# Patient Record
Sex: Female | Born: 1985 | Race: White | Hispanic: No | Marital: Married | Smoking: Never smoker
Health system: Southern US, Community
[De-identification: ages and names within clinical notes are randomized; demographics above are authoritative.]

## PROBLEM LIST (undated history)

## (undated) DIAGNOSIS — U071 COVID-19: Secondary | ICD-10-CM

## (undated) DIAGNOSIS — F32A Depression, unspecified: Secondary | ICD-10-CM

## (undated) DIAGNOSIS — F329 Major depressive disorder, single episode, unspecified: Secondary | ICD-10-CM

## (undated) DIAGNOSIS — M9261 Juvenile osteochondrosis of tarsus, right ankle: Secondary | ICD-10-CM

## (undated) DIAGNOSIS — F419 Anxiety disorder, unspecified: Secondary | ICD-10-CM

## (undated) DIAGNOSIS — M7661 Achilles tendinitis, right leg: Secondary | ICD-10-CM

## (undated) DIAGNOSIS — F909 Attention-deficit hyperactivity disorder, unspecified type: Secondary | ICD-10-CM

## (undated) DIAGNOSIS — R519 Headache, unspecified: Secondary | ICD-10-CM

## (undated) DIAGNOSIS — M79671 Pain in right foot: Secondary | ICD-10-CM

## (undated) DIAGNOSIS — R1013 Epigastric pain: Secondary | ICD-10-CM

## (undated) HISTORY — DX: Pain in right foot: M79.671

## (undated) HISTORY — DX: Attention-deficit hyperactivity disorder, unspecified type: F90.9

## (undated) HISTORY — PX: OTHER SURGICAL HISTORY: SHX169

## (undated) HISTORY — DX: Headache, unspecified: R51.9

## (undated) HISTORY — DX: Epigastric pain: R10.13

## (undated) HISTORY — PX: CHOLECYSTECTOMY: SHX55

## (undated) HISTORY — DX: COVID-19: U07.1

## (undated) HISTORY — DX: Achilles tendinitis, right leg: M76.61

## (undated) HISTORY — PX: KNEE ARTHROSCOPY: SUR90

## (undated) HISTORY — PX: DILATION AND CURETTAGE OF UTERUS: SHX78

## (undated) HISTORY — DX: Juvenile osteochondrosis of tarsus, right ankle: M92.61

---
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
## 2019-11-07 ENCOUNTER — Ambulatory Visit: Payer: BC Managed Care – PPO | Attending: Internal Medicine

## 2019-11-07 DIAGNOSIS — Z20822 Contact with and (suspected) exposure to covid-19: Secondary | ICD-10-CM

## 2019-11-09 LAB — NOVEL CORONAVIRUS, NAA: SARS-CoV-2, NAA: NOT DETECTED

## 2019-11-11 ENCOUNTER — Other Ambulatory Visit: Payer: Self-pay

## 2019-11-11 ENCOUNTER — Emergency Department (HOSPITAL_COMMUNITY)
Admission: EM | Admit: 2019-11-11 | Discharge: 2019-11-12 | Disposition: A | Discharge status: Home / Self Care | Payer: BC Managed Care – PPO | Attending: Emergency Medicine | Admitting: Emergency Medicine

## 2019-11-11 ENCOUNTER — Encounter (HOSPITAL_COMMUNITY): Payer: Self-pay | Admitting: *Deleted

## 2019-11-11 DIAGNOSIS — O26891 Other specified pregnancy related conditions, first trimester: Secondary | ICD-10-CM

## 2019-11-11 DIAGNOSIS — Z3A11 11 weeks gestation of pregnancy: Secondary | ICD-10-CM | POA: Diagnosis not present

## 2019-11-11 DIAGNOSIS — O200 Threatened abortion: Secondary | ICD-10-CM | POA: Diagnosis not present

## 2019-11-11 DIAGNOSIS — Z6791 Unspecified blood type, Rh negative: Secondary | ICD-10-CM | POA: Insufficient documentation

## 2019-11-11 DIAGNOSIS — O4691 Antepartum hemorrhage, unspecified, first trimester: Secondary | ICD-10-CM | POA: Diagnosis present

## 2019-11-11 DIAGNOSIS — Z2913 Encounter for prophylactic Rho(D) immune globulin: Secondary | ICD-10-CM | POA: Insufficient documentation

## 2019-11-11 DIAGNOSIS — O4690 Antepartum hemorrhage, unspecified, unspecified trimester: Secondary | ICD-10-CM

## 2019-11-11 HISTORY — DX: Anxiety disorder, unspecified: F41.9

## 2019-11-11 HISTORY — DX: Depression, unspecified: F32.A

## 2019-11-11 LAB — CBC WITH DIFFERENTIAL/PLATELET
Abs Immature Granulocytes: 0.02 10*3/uL (ref 0.00–0.07)
Basophils Absolute: 0 10*3/uL (ref 0.0–0.1)
Basophils Relative: 0 %
Eosinophils Absolute: 0.1 10*3/uL (ref 0.0–0.5)
Eosinophils Relative: 1 %
HCT: 41.4 % (ref 36.0–46.0)
Hemoglobin: 14 g/dL (ref 12.0–15.0)
Immature Granulocytes: 0 %
Lymphocytes Relative: 34 %
Lymphs Abs: 3.1 10*3/uL (ref 0.7–4.0)
MCH: 29.4 pg (ref 26.0–34.0)
MCHC: 33.8 g/dL (ref 30.0–36.0)
MCV: 87 fL (ref 80.0–100.0)
Monocytes Absolute: 0.6 10*3/uL (ref 0.1–1.0)
Monocytes Relative: 7 %
Neutro Abs: 5.1 10*3/uL (ref 1.7–7.7)
Neutrophils Relative %: 58 %
Platelets: 340 10*3/uL (ref 150–400)
RBC: 4.76 MIL/uL (ref 3.87–5.11)
RDW: 12.4 % (ref 11.5–15.5)
WBC: 9 10*3/uL (ref 4.0–10.5)
nRBC: 0 % (ref 0.0–0.2)

## 2019-11-11 NOTE — ED Triage Notes (Signed)
Pt with abd pain earlier, mild pain at present.  Vaginal bleeding started about 30 minutes ago, pink in color, denies clots per pt. Pt states this is her first pregnancy. Last office visit was 10/10/19.

## 2019-11-11 NOTE — ED Provider Notes (Addendum)
[facility] EMERGENCY DEPARTMENT Provider Note   CSN: [id number] Arrival date & time: 11/11/19  2253     History Chief Complaint  Patient presents with  . Vaginal Bleeding    Jennifer Meyers is a 34 y.o. female.  HPI patient is G1, P0 Ab0, last normal period started October 22.  She is followed by [facility] in [place].  She states she will be 12 weeks tomorrow.  She has had a normal pregnancy so far with some minor spotting earlier on in her pregnancy.  She states about 1045 she started having bleeding which was heavier than spotting but not as heavy as a period.  She has some minor discomfort in her lower groin that feels like menstrual cramping but she only rates it a 1 or 2 out of 10.  She denies dysuria but has had frequency since she got pregnant.  She states she is not wearing a pad.  PCP Patient, No Pcp Per      Past Medical History:  Diagnosis Date  . Anxiety   . Depression     There are no problems to display for this patient.   History reviewed. No pertinent surgical history.   OB History    Gravida  1   Para      Term      Preterm      AB      Living        SAB      TAB      Ectopic      Multiple      Live Births              History reviewed. No pertinent family history.  Social History   Tobacco Use  . Smoking status: Never Smoker  . Smokeless tobacco: Never Used  Substance Use Topics  . Alcohol use: Not Currently  . Drug use: Never    Home Medications Prior to Admission medications   Not on File  PNV  Allergies    Sulfa antibiotics  Review of Systems   Review of Systems  All other systems reviewed and are negative.   Physical Exam Updated Vital Signs BP 131/81 (BP Location: Left Arm)   Pulse 82   Resp 16   Ht 5\' 11"  (1.803 m)   Wt 90.7 kg   LMP 08/21/2019   SpO2 96%   BMI 27.89 kg/m   Physical Exam Vitals and nursing note reviewed.  Constitutional:      General: She is not in acute  distress.    Appearance: Normal appearance. She is well-developed. She is not ill-appearing or toxic-appearing.  HENT:     Head: Normocephalic and atraumatic.     Right Ear: External ear normal.     Left Ear: External ear normal.     Nose: No mucosal edema or rhinorrhea.     Mouth/Throat:     Dentition: No dental abscesses.     Pharynx: No uvula swelling.  Eyes:     Extraocular Movements: Extraocular movements intact.     Conjunctiva/sclera: Conjunctivae normal.     Pupils: Pupils are equal, round, and reactive to light.  Cardiovascular:     Rate and Rhythm: Normal rate and regular rhythm.     Heart sounds: Normal heart sounds. No murmur. No friction rub. No gallop.   Pulmonary:     Effort: Pulmonary effort is normal. No respiratory distress.     Breath sounds: Normal breath sounds. No wheezing,  rhonchi or rales.  Chest:     Chest wall: No tenderness or crepitus.  Abdominal:     General: Bowel sounds are normal. There is no distension.     Palpations: Abdomen is soft.     Tenderness: There is no abdominal tenderness. There is no guarding or rebound.  Genitourinary:    Comments: Normal external genitalia, there is no blood in the vault.  When the speculum was removed there is no blood on the speculum.  Her cervix appears closed.  Her uterus feels enlarged consistent with dates, her right ovary is minimally tender without masses, her left ovary is nontender.  Her cervix is closed.  I was in the room when we attempted fetal heart tones and we were unable to find them for a long enough time to get a accurate heart rate. Musculoskeletal:        General: No tenderness. Normal range of motion.     Cervical back: Full passive range of motion without pain, normal range of motion and neck supple.     Comments: Moves all extremities well.   Skin:    General: Skin is warm and dry.     Coloration: Skin is not pale.     Findings: No erythema or rash.  Neurological:     General: No focal  deficit present.     Mental Status: She is alert and oriented to person, place, and time.     Cranial Nerves: No cranial nerve deficit.  Psychiatric:        Mood and Affect: Mood is anxious.        Speech: Speech normal.        Behavior: Behavior normal.        Thought Content: Thought content normal.     ED Results / Procedures / Treatments   Labs (all labs ordered are listed, but only abnormal results are displayed) Results for orders placed or performed during the hospital encounter of 11/11/19  Wet prep, genital   Specimen: PATH Cytology Cervicovaginal Ancillary Only  Result Value Ref Range   Yeast Wet Prep HPF POC NONE SEEN NONE SEEN   Trich, Wet Prep NONE SEEN NONE SEEN   Clue Cells Wet Prep HPF POC NONE SEEN NONE SEEN   WBC, Wet Prep HPF POC FEW (A) NONE SEEN   Sperm NONE SEEN   hCG, quantitative, pregnancy  Result Value Ref Range   hCG, Beta Chain, Quant, S 16,816 (H) <5 mIU/mL  CBC with Differential  Result Value Ref Range   WBC 9.0 4.0 - 10.5 K/uL   RBC 4.76 3.87 - 5.11 MIL/uL   Hemoglobin 14.0 12.0 - 15.0 g/dL   HCT 38.1 01.7 - 51.0 %   MCV 87.0 80.0 - 100.0 fL   MCH 29.4 26.0 - 34.0 pg   MCHC 33.8 30.0 - 36.0 g/dL   RDW 25.8 52.7 - 78.2 %   Platelets 340 150 - 400 K/uL   nRBC 0.0 0.0 - 0.2 %   Neutrophils Relative % 58 %   Neutro Abs 5.1 1.7 - 7.7 K/uL   Lymphocytes Relative 34 %   Lymphs Abs 3.1 0.7 - 4.0 K/uL   Monocytes Relative 7 %   Monocytes Absolute 0.6 0.1 - 1.0 K/uL   Eosinophils Relative 1 %   Eosinophils Absolute 0.1 0.0 - 0.5 K/uL   Basophils Relative 0 %   Basophils Absolute 0.0 0.0 - 0.1 K/uL   Immature Granulocytes 0 %   Abs Immature  Granulocytes 0.02 0.00 - 0.07 K/uL  Urinalysis, Routine w reflex microscopic  Result Value Ref Range   Color, Urine YELLOW YELLOW   APPearance HAZY (A) CLEAR   Specific Gravity, Urine 1.018 1.005 - 1.030   pH 5.0 5.0 - 8.0   Glucose, UA NEGATIVE NEGATIVE mg/dL   Hgb urine dipstick SMALL (A) NEGATIVE    Bilirubin Urine NEGATIVE NEGATIVE   Ketones, ur NEGATIVE NEGATIVE mg/dL   Protein, ur NEGATIVE NEGATIVE mg/dL   Nitrite NEGATIVE NEGATIVE   Leukocytes,Ua NEGATIVE NEGATIVE   RBC / HPF 0-5 0 - 5 RBC/hpf   WBC, UA 0-5 0 - 5 WBC/hpf   Bacteria, UA NONE SEEN NONE SEEN   Squamous Epithelial / LPF 0-5 0 - 5   Mucus PRESENT   ABO/Rh  Result Value Ref Range   ABO/RH(D)      O NEG Performed at [facility], [address], [place], [place] [address]   Rh IG workup (includes ABO/Rh)  Result Value Ref Range   Gestational Age(Wks) 11    ABO/RH(D) O NEG    Unit Number [id number]    Blood Component Type RHIG    Unit division 00    Status of Unit ISSUED    Transfusion Status      OK TO TRANSFUSE Performed at [facility], [address] [place], [place] [address]    Laboratory interpretation all normal except pt is O neg    EKG None  Radiology No results found.  Procedures Procedures (including critical care time)  Medications Ordered in ED Medications  rho (d) immune globulin (RHIG/RHOPHYLAC) injection 49.5 mcg (has no administration in time range)    ED Course  I have reviewed the triage vital signs and the nursing notes.  Pertinent labs & imaging results that were available during my care of the patient were reviewed by me and considered in my medical decision making (see chart for details).    MDM Rules/Calculators/A&P                      Patient is extremely worried about having a pelvic exam and it being "safe".  I have reassured her that this is safe and necessary to look at her cervix and see how much bleeding she is having.  I asked patient if she was aware she was O- and she states her doctor had mentioned it but did not go into much detail.  We discussed that she needed to get a RhoGam injection tonight and that her doctor would also be doing it later on in her pregnancy and after she delivers.  I discussed with her that this baby will be fine however this is  done to protect her further children.  She again is anxious and asking if it is safe to get it.  When I do the MD Calc pregnancy due date calculator she is 11 weeks and 3/7 days pregnant.  She was given RhoGam 50 mcg.  Patient is to return later this morning to get an outpatient pelvic ultrasound.  We discussed she should have pelvic rest until her OB he states it is okay to resume having sex.  Final Clinical Impression(s) / ED Diagnoses Final diagnoses:  Vaginal bleeding in pregnancy  Threatened miscarriage  Rh negative status during pregnancy in first trimester    Rx / DC Orders ED Discharge Orders         Ordered    US PELVIC COMPLETE WITH TRANSVAGINAL  11/12/19 0056          Plan discharge  Devoria Albe, MD, Concha Pyo, MD 11/12/19 Nydia Bouton    Devoria Albe, MD 11/12/19 [phone]

## 2019-11-12 ENCOUNTER — Ambulatory Visit (HOSPITAL_COMMUNITY)
Admission: RE | Admit: 2019-11-12 | Discharge: 2019-11-12 | Disposition: A | Discharge status: Home / Self Care | Payer: BC Managed Care – PPO | Source: Ambulatory Visit | Attending: Emergency Medicine | Admitting: Emergency Medicine

## 2019-11-12 ENCOUNTER — Other Ambulatory Visit (HOSPITAL_COMMUNITY): Payer: Self-pay | Admitting: Emergency Medicine

## 2019-11-12 DIAGNOSIS — O209 Hemorrhage in early pregnancy, unspecified: Secondary | ICD-10-CM

## 2019-11-12 LAB — WET PREP, GENITAL
Clue Cells Wet Prep HPF POC: NONE SEEN
Sperm: NONE SEEN
Trich, Wet Prep: NONE SEEN
Yeast Wet Prep HPF POC: NONE SEEN

## 2019-11-12 LAB — URINALYSIS, ROUTINE W REFLEX MICROSCOPIC
Bacteria, UA: NONE SEEN
Bilirubin Urine: NEGATIVE
Glucose, UA: NEGATIVE mg/dL
Ketones, ur: NEGATIVE mg/dL
Leukocytes,Ua: NEGATIVE
Nitrite: NEGATIVE
Protein, ur: NEGATIVE mg/dL
Specific Gravity, Urine: 1.018 (ref 1.005–1.030)
pH: 5 (ref 5.0–8.0)

## 2019-11-12 LAB — ABO/RH: ABO/RH(D): O NEG

## 2019-11-12 LAB — HCG, QUANTITATIVE, PREGNANCY: hCG, Beta Chain, Quant, S: 16816 m[IU]/mL — ABNORMAL HIGH (ref <5)

## 2019-11-12 MED ORDER — RHO D IMMUNE GLOBULIN 1500 UNIT/2ML IJ SOSY
50.0000 ug | PREFILLED_SYRINGE | Freq: Once | INTRAMUSCULAR | Status: AC
  Administered 2019-11-12: 49.5 ug via INTRAMUSCULAR

## 2019-11-12 NOTE — ED Notes (Signed)
Unable to obtain. Baby moving.

## 2019-11-12 NOTE — Discharge Instructions (Addendum)
No sex, douching, using tampons, until your OB at [facility] states it is okay. You can take acetaminophen/tylenol for pain if needed. Return to the ED if you get severe abdominal pain, have bleeding heavier than a regular period, or if you pass gray tissue. Any bleeding during the first trimester or before 20 weeks is considered a threatened miscarriage. You can still have a normal pregnancy. You received rhogam 50 mcg IM tonight. Please call [phone] at 8:30 this morning to return to get an ultrasound done to look at your baby this morning. Please call your doctors at [facility] on Monday to let them know about your ED visit. They may want to see you earlier in the week.

## 2019-11-13 LAB — RH IG WORKUP (INCLUDES ABO/RH)
ABO/RH(D): O NEG
Gestational Age(Wks): 11
Unit division: 0

## 2019-11-14 LAB — GC/CHLAMYDIA PROBE AMP (~~LOC~~) NOT AT ARMC
Chlamydia: NEGATIVE
Neisseria Gonorrhea: NEGATIVE

## 2019-11-15 ENCOUNTER — Ambulatory Visit: Payer: BC Managed Care – PPO | Attending: Internal Medicine

## 2019-11-15 DIAGNOSIS — Z20822 Contact with and (suspected) exposure to covid-19: Secondary | ICD-10-CM

## 2019-11-16 LAB — NOVEL CORONAVIRUS, NAA: SARS-CoV-2, NAA: NOT DETECTED

## 2020-03-06 ENCOUNTER — Inpatient Hospital Stay
Admission: EM | Admit: 2020-03-06 | Discharge: 2020-03-09 | DRG: 419 | Disposition: A | Discharge status: Home / Self Care | Payer: BC Managed Care – PPO | Attending: Internal Medicine | Admitting: Internal Medicine

## 2020-03-06 ENCOUNTER — Encounter: Payer: Self-pay | Admitting: Emergency Medicine

## 2020-03-06 ENCOUNTER — Observation Stay: Payer: BC Managed Care – PPO

## 2020-03-06 ENCOUNTER — Emergency Department: Payer: BC Managed Care – PPO

## 2020-03-06 ENCOUNTER — Other Ambulatory Visit: Payer: Self-pay

## 2020-03-06 ENCOUNTER — Observation Stay (HOSPITAL_COMMUNITY): Payer: BC Managed Care – PPO

## 2020-03-06 DIAGNOSIS — R1013 Epigastric pain: Secondary | ICD-10-CM

## 2020-03-06 DIAGNOSIS — R7989 Other specified abnormal findings of blood chemistry: Secondary | ICD-10-CM

## 2020-03-06 DIAGNOSIS — Z79899 Other long term (current) drug therapy: Secondary | ICD-10-CM

## 2020-03-06 DIAGNOSIS — F329 Major depressive disorder, single episode, unspecified: Secondary | ICD-10-CM | POA: Diagnosis present

## 2020-03-06 DIAGNOSIS — K8050 Calculus of bile duct without cholangitis or cholecystitis without obstruction: Secondary | ICD-10-CM | POA: Diagnosis not present

## 2020-03-06 DIAGNOSIS — K8020 Calculus of gallbladder without cholecystitis without obstruction: Secondary | ICD-10-CM

## 2020-03-06 DIAGNOSIS — K219 Gastro-esophageal reflux disease without esophagitis: Secondary | ICD-10-CM | POA: Diagnosis present

## 2020-03-06 DIAGNOSIS — K8042 Calculus of bile duct with acute cholecystitis without obstruction: Principal | ICD-10-CM | POA: Diagnosis present

## 2020-03-06 DIAGNOSIS — F419 Anxiety disorder, unspecified: Secondary | ICD-10-CM | POA: Diagnosis present

## 2020-03-06 DIAGNOSIS — Z20822 Contact with and (suspected) exposure to covid-19: Secondary | ICD-10-CM | POA: Diagnosis present

## 2020-03-06 DIAGNOSIS — Z881 Allergy status to other antibiotic agents status: Secondary | ICD-10-CM

## 2020-03-06 DIAGNOSIS — R1011 Right upper quadrant pain: Secondary | ICD-10-CM

## 2020-03-06 LAB — CBC WITH DIFFERENTIAL/PLATELET
Abs Immature Granulocytes: 0.06 10*3/uL (ref 0.00–0.07)
Basophils Absolute: 0.1 10*3/uL (ref 0.0–0.1)
Basophils Relative: 0 %
Eosinophils Absolute: 0.1 10*3/uL (ref 0.0–0.5)
Eosinophils Relative: 1 %
HCT: 41.7 % (ref 36.0–46.0)
Hemoglobin: 14.7 g/dL (ref 12.0–15.0)
Immature Granulocytes: 0 %
Lymphocytes Relative: 22 %
Lymphs Abs: 3 10*3/uL (ref 0.7–4.0)
MCH: 29.9 pg (ref 26.0–34.0)
MCHC: 35.3 g/dL (ref 30.0–36.0)
MCV: 84.9 fL (ref 80.0–100.0)
Monocytes Absolute: 0.8 10*3/uL (ref 0.1–1.0)
Monocytes Relative: 6 %
Neutro Abs: 9.8 10*3/uL — ABNORMAL HIGH (ref 1.7–7.7)
Neutrophils Relative %: 71 %
Platelets: 362 10*3/uL (ref 150–400)
RBC: 4.91 MIL/uL (ref 3.87–5.11)
RDW: 12.4 % (ref 11.5–15.5)
WBC: 13.8 10*3/uL — ABNORMAL HIGH (ref 4.0–10.5)
nRBC: 0 % (ref 0.0–0.2)

## 2020-03-06 LAB — TROPONIN I (HIGH SENSITIVITY): Troponin I (High Sensitivity): <2 ng/L (ref <18)

## 2020-03-06 LAB — URINALYSIS, COMPLETE (UACMP) WITH MICROSCOPIC
Bacteria, UA: NONE SEEN
Bilirubin Urine: NEGATIVE
Glucose, UA: NEGATIVE mg/dL
Hgb urine dipstick: NEGATIVE
Ketones, ur: NEGATIVE mg/dL
Leukocytes,Ua: NEGATIVE
Nitrite: NEGATIVE
Protein, ur: 30 mg/dL — AB
Specific Gravity, Urine: 1.018 (ref 1.005–1.030)
pH: 9 — ABNORMAL HIGH (ref 5.0–8.0)

## 2020-03-06 LAB — RESPIRATORY PANEL BY RT PCR (FLU A&B, COVID)
Influenza A by PCR: NEGATIVE
Influenza B by PCR: NEGATIVE
SARS Coronavirus 2 by RT PCR: NEGATIVE

## 2020-03-06 LAB — COMPREHENSIVE METABOLIC PANEL
ALT: 55 U/L — ABNORMAL HIGH (ref 0–44)
AST: 107 U/L — ABNORMAL HIGH (ref 15–41)
Albumin: 4.2 g/dL (ref 3.5–5.0)
Alkaline Phosphatase: 92 U/L (ref 38–126)
Anion gap: 7 (ref 5–15)
BUN: 12 mg/dL (ref 6–20)
CO2: 27 mmol/L (ref 22–32)
Calcium: 9.3 mg/dL (ref 8.9–10.3)
Chloride: 100 mmol/L (ref 98–111)
Creatinine, Ser: 0.66 mg/dL (ref 0.44–1.00)
GFR calc Af Amer: >60 mL/min (ref >60)
GFR calc non Af Amer: >60 mL/min (ref >60)
Glucose, Bld: 111 mg/dL — ABNORMAL HIGH (ref 70–99)
Potassium: 3.8 mmol/L (ref 3.5–5.1)
Sodium: 134 mmol/L — ABNORMAL LOW (ref 135–145)
Total Bilirubin: 1.1 mg/dL (ref 0.3–1.2)
Total Protein: 7.6 g/dL (ref 6.5–8.1)

## 2020-03-06 LAB — POCT PREGNANCY, URINE: Preg Test, Ur: NEGATIVE

## 2020-03-06 LAB — HIV ANTIBODY (ROUTINE TESTING W REFLEX): HIV Screen 4th Generation wRfx: NONREACTIVE

## 2020-03-06 LAB — LIPASE, BLOOD: Lipase: 30 U/L (ref 11–51)

## 2020-03-06 LAB — PREGNANCY, URINE: Preg Test, Ur: NEGATIVE

## 2020-03-06 MED ORDER — MORPHINE SULFATE (PF) 2 MG/ML IV SOLN
2.0000 mg | INTRAVENOUS | Status: DC | PRN
  Administered 2020-03-06 – 2020-03-09 (×5): 2 mg via INTRAVENOUS
  Filled 2020-03-06 (×5): qty 1

## 2020-03-06 MED ORDER — ONDANSETRON HCL 4 MG/2ML IJ SOLN
4.0000 mg | Freq: Four times a day (QID) | INTRAMUSCULAR | Status: DC | PRN
  Administered 2020-03-08: 4 mg via INTRAVENOUS
  Filled 2020-03-06: qty 2

## 2020-03-06 MED ORDER — MORPHINE SULFATE (PF) 4 MG/ML IV SOLN
4.0000 mg | Freq: Once | INTRAVENOUS | Status: AC
  Administered 2020-03-06: 4 mg via INTRAVENOUS

## 2020-03-06 MED ORDER — SODIUM CHLORIDE 0.9 % IV SOLN: INTRAVENOUS | Status: DC

## 2020-03-06 MED ORDER — ONDANSETRON HCL 4 MG/2ML IJ SOLN
INTRAMUSCULAR | Status: AC
  Filled 2020-03-06: qty 2

## 2020-03-06 MED ORDER — ONDANSETRON HCL 4 MG PO TABS: 4.0000 mg | ORAL_TABLET | Freq: Four times a day (QID) | ORAL | Status: DC | PRN

## 2020-03-06 MED ORDER — MORPHINE SULFATE (PF) 4 MG/ML IV SOLN
INTRAVENOUS | Status: AC
  Filled 2020-03-06: qty 1

## 2020-03-06 MED ORDER — LORAZEPAM 0.5 MG PO TABS: 0.5000 mg | ORAL_TABLET | Freq: Once | ORAL | Status: DC | PRN

## 2020-03-06 MED ORDER — ONDANSETRON HCL 4 MG/2ML IJ SOLN
4.0000 mg | Freq: Once | INTRAMUSCULAR | Status: AC
  Administered 2020-03-06: 4 mg via INTRAVENOUS

## 2020-03-06 MED ORDER — GADOBUTROL 1 MMOL/ML IV SOLN
7.5000 mL | Freq: Once | INTRAVENOUS | Status: AC | PRN
  Administered 2020-03-06: 7.5 mL via INTRAVENOUS
  Filled 2020-03-06: qty 7.5

## 2020-03-06 MED ORDER — PIPERACILLIN-TAZOBACTAM 3.375 G IVPB
3.3750 g | Freq: Three times a day (TID) | INTRAVENOUS | Status: DC
  Administered 2020-03-06 – 2020-03-09 (×9): 3.375 g via INTRAVENOUS
  Filled 2020-03-06 (×11): qty 50

## 2020-03-06 MED ORDER — SODIUM CHLORIDE 0.9 % IV SOLN
1.5000 g | Freq: Once | INTRAVENOUS | Status: AC
  Administered 2020-03-06: 1.5 g via INTRAVENOUS
  Filled 2020-03-06: qty 4

## 2020-03-06 MED ORDER — KETOROLAC TROMETHAMINE 30 MG/ML IJ SOLN
30.0000 mg | Freq: Four times a day (QID) | INTRAMUSCULAR | Status: DC | PRN
  Administered 2020-03-06 – 2020-03-09 (×5): 30 mg via INTRAVENOUS
  Filled 2020-03-06 (×5): qty 1

## 2020-03-06 MED ORDER — MORPHINE SULFATE (PF) 2 MG/ML IV SOLN
2.0000 mg | Freq: Once | INTRAVENOUS | Status: AC
  Administered 2020-03-06: 2 mg via INTRAVENOUS
  Filled 2020-03-06: qty 1

## 2020-03-06 NOTE — ED Notes (Signed)
Pt reports relief following administration of pain medication. Instructed on what to do should pain increase or she feels as though she will pass out. Call light in reach, will continue to monitor.

## 2020-03-06 NOTE — ED Triage Notes (Signed)
Pt to triage via w/c, brought in by EMS from home; pt appears uncomfortable, grimacing; st sudden onset 2hrs PTA of upper abd pain radiating into back with no accomp symptoms; denies hx of same; upper abd tender to palpation, esp to rt upper

## 2020-03-06 NOTE — Consult Note (Signed)
Jennifer Antigua, MD [address] [place], [place], [place], [address] [address], [place], [place], [place], [address] Phone: [phone]  Fax: [phone]  Consultation  Referring Provider:     Dr. Damita Dunnings Primary Care Physician:  Adin Hector, MD Reason for Consultation:     Choledocholithiasis  Date of Admission:  03/06/2020 Date of Consultation:  03/06/2020         HPI:   Jennifer Meyers is a 34 y.o. female with right upper quadrant abdominal pain that started yesterday night associated with one episode of emesis.  No hematemesis.  No altered bowel habits or blood in stool.  No prior history of similar symptoms.  Labs show normal liver enzymes, and right upper quadrant ultrasound shows cholelithiasis and 3 mm stone in the common bile duct.  No fever.  Past Medical History:  Diagnosis Date  . Anxiety   . Depression     Past Surgical History:  Procedure Laterality Date  . DILATION AND CURETTAGE OF UTERUS      Prior to Admission medications   Medication Sig Start Date End Date Taking? Authorizing Provider  citalopram (CELEXA) 20 MG tablet Take 20 mg by mouth daily. 02/02/20  Yes [provider]  ibuprofen (ADVIL) 800 MG tablet Take 800 mg by mouth 3 (three) times daily as needed for pain. 11/30/19  Yes [provider]  montelukast (SINGULAIR) 10 MG tablet Take 10 mg by mouth daily. 02/02/20  Yes [provider]  Prenatal MV-Min-Fe Fum-FA-DHA (VITAFOL-OB+DHA) 65-1 & 250 MG MISC Take 1 packet by mouth daily. 01/15/20  Yes [provider]    No family history on file.   Social History   Tobacco Use  . Smoking status: Never Smoker  . Smokeless tobacco: Never Used  Substance Use Topics  . Alcohol use: Not Currently  . Drug use: Never    Allergies as of 03/06/2020 - Review Complete 03/06/2020  Allergen Reaction Noted  . Sulfa antibiotics Anaphylaxis 11/11/2019    Review of Systems:    All systems reviewed and negative  except where noted in HPI.   Physical Exam:  Vital signs in last 24 hours: Vitals:   03/06/20 0800 03/06/20 0900 03/06/20 1000 03/06/20 1100  BP: 97/69 92/68 90/65 103/63  Pulse: 69 72 65 70  Resp: _0 Temp:    98 F (36.7 C)  TempSrc:    Oral  SpO2: 98% 98% 98% 99%  Weight:      Height:         General:   Pleasant, cooperative in NAD Head:  Normocephalic and atraumatic. Eyes:   No icterus.   Conjunctiva pink. PERRLA. Ears:  Normal auditory acuity. Neck:  Supple; no masses or thyroidomegaly Lungs: Respirations even and unlabored. Lungs clear to auscultation bilaterally.   No wheezes, crackles, or rhonchi.  Abdomen:  Soft, nondistended, nontender. Normal bowel sounds. No appreciable masses or hepatomegaly.  No rebound or guarding.  Neurologic:  Alert and oriented x3;  grossly normal neurologically. Skin:  Intact without significant lesions or rashes. Cervical Nodes:  No significant cervical adenopathy. Psych:  Alert and cooperative. Normal affect.  LAB RESULTS: Recent Labs    03/06/20 0018  WBC 13.8*  HGB 14.7  HCT 41.7  PLT 362   BMET Recent Labs    03/06/20 0018  NA 134*  K 3.8  CL 100  CO2 27  GLUCOSE 111*  BUN 12  CREATININE 0.66  CALCIUM 9.3  LFT Recent Labs    03/06/20 0018  PROT 7.6  ALBUMIN 4.2  AST 107*  ALT 55*  ALKPHOS 92  BILITOT 1.1   PT/INR No results for input(s): LABPROT, INR in the last 72 hours.  STUDIES: US ABDOMEN LIMITED RUQ  Result Date: 03/06/2020 CLINICAL DATA:  Right upper quadrant pain EXAM: ULTRASOUND ABDOMEN LIMITED RIGHT UPPER QUADRANT COMPARISON:  None. FINDINGS: Gallbladder: Layering and shadowing gallstones. No wall thickening or focal tenderness. Common bile duct: Diameter: 5 mm. Although normal diameter there is visible echogenic foci consistent with calculi measuring up to 3 mm. Liver: No focal lesion identified. Within normal limits in parenchymal echogenicity. Portal vein is patent on color Doppler  imaging with normal direction of blood flow towards the liver. IMPRESSION: 1. Cholelithiasis and choledocholithiasis. 2. No evidence of acute cholecystitis. Electronically Signed   By: Monte Fantasia M.D.   On: 03/06/2020 04:06      Impression / Plan:   Jennifer Meyers is a 34 y.o. y/o female with right upper quadrant abdominal pain and ultrasound showing cholelithiasis and choledocholithiasis  Since alk phos and bilirubin are normal and right upper quadrant showed less than 5 mm stone in the common bile duct, would recommend MRCP to document if CBD stone is present.  Usually stones less than 5 mm do pass on their own.  If MRCP still shows presence of CBD stone, will discuss with Dr. Verl Blalock about proceeding with ERCP.  If MRCP negative, timing of cholecystectomy with possible intraoperative cholangiogram to be determined by surgery team  Can start clear liquid diet if patient tolerates after her MRCP.  N.p.o. past midnight.  Thank you for involving me in the care of this patient.      LOS: 0 days   Virgel Manifold, MD  03/06/2020, 1:05 PM

## 2020-03-06 NOTE — ED Notes (Signed)
Pt states she was awaken from her sleep at 10PM tonight due to pain in the abdomen and pain that radiated to the back. Pt reports normal BM. Denies change in diet. Pt is tender to palpation on the RUQ. Denies N/V/D

## 2020-03-06 NOTE — ED Notes (Signed)
Pt husband came running from room and states that she "passed out in bed." Carollee Herter, RN stepped into room and states she sternal rubbed pt and pt opened eyes and appeared to open eyes looking similar to waking up from sleep. Dr. Manson Passey notified and treatment team entered room. Orders to follow.

## 2020-03-06 NOTE — H&P (View-Only) (Signed)
SURGICAL CONSULTATION NOTE   HISTORY OF PRESENT ILLNESS (HPI):  34 y.o. female presented to [facility] ED for evaluation of abdominal pain that started yesterday at 10 pm. Patient reports started on the epigastric area. Pain also radiates to right upper quadrant. No aggravating factors. Alleviating factor has been IV pain medications since admission. Denies fever or chills. Denies previous episodes of biliary colic.   At ED she had ultrasound of the abdomen showing cholelithiasis stones in the common bile duct. No CBD duct dilation. No sign of cholecystitis. Normal bilirubin and alkaline phosphatase. No WBC count. I personally evaluated the images and the labs  Surgery is consulted by Dr. Roxan Hockey in this context for evaluation and management of cholelithiasis with choledocholitiasis.  PAST MEDICAL HISTORY (PMH):  Past Medical History:  Diagnosis Date  . Anxiety   . Depression      PAST SURGICAL HISTORY (PSH):  Past Surgical History:  Procedure Laterality Date  . DILATION AND CURETTAGE OF UTERUS       MEDICATIONS:  Prior to Admission medications   Medication Sig Start Date End Date Taking? Authorizing Provider  citalopram (CELEXA) 20 MG tablet Take 20 mg by mouth daily. 02/02/20  Yes [provider]  ibuprofen (ADVIL) 800 MG tablet Take 800 mg by mouth 3 (three) times daily as needed for pain. 11/30/19  Yes [provider]  montelukast (SINGULAIR) 10 MG tablet Take 10 mg by mouth daily. 02/02/20  Yes [provider]  Prenatal MV-Min-Fe Fum-FA-DHA (VITAFOL-OB+DHA) 65-1 & 250 MG MISC Take 1 packet by mouth daily. 01/15/20  Yes [provider]     ALLERGIES:  Allergies  Allergen Reactions  . Sulfa Antibiotics Anaphylaxis     SOCIAL HISTORY:  Social History   Socioeconomic History  . Marital status: Single    Spouse name: Not on file  . Number of children: Not on file  . Years of education: Not on file  . Highest education level: Not on file   Occupational History  . Not on file  Tobacco Use  . Smoking status: Never Smoker  . Smokeless tobacco: Never Used  Substance and Sexual Activity  . Alcohol use: Not Currently  . Drug use: Never  . Sexual activity: Not on file  Other Topics Concern  . Not on file  Social History Narrative  . Not on file   Social Determinants of Health   Financial Resource Strain:   . Difficulty of Paying Living Expenses:   Food Insecurity:   . Worried About Programme researcher, broadcasting/film/video in the Last Year:   . Barista in the Last Year:   Transportation Needs:   . Freight forwarder (Medical):   Marland Kitchen Lack of Transportation (Non-Medical):   Physical Activity:   . Days of Exercise per Week:   . Minutes of Exercise per Session:   Stress:   . Feeling of Stress :   Social Connections:   . Frequency of Communication with Friends and Family:   . Frequency of Social Gatherings with Friends and Family:   . Attends Religious Services:   . Active Member of Clubs or Organizations:   . Attends Banker Meetings:   Marland Kitchen Marital Status:   Intimate Partner Violence:   . Fear of Current or Ex-Partner:   . Emotionally Abused:   Marland Kitchen Physically Abused:   . Sexually Abused:       FAMILY HISTORY:  History reviewed. No pertinent family history.   REVIEW OF SYSTEMS:  Constitutional: denies weight loss, fever, chills, or sweats  Eyes: denies any other vision changes, history of eye injury  ENT: denies sore throat, hearing problems  Respiratory: denies shortness of breath, wheezing  Cardiovascular: denies chest pain, palpitations  Gastrointestinal: positive abdominal pain, nausea and vomiting Genitourinary: denies burning with urination or urinary frequency Musculoskeletal: denies any other joint pains or cramps  Skin: denies any other rashes or skin discolorations  Neurological: denies any other headache, dizziness, weakness  Psychiatric: denies any other depression, anxiety   All other review  of systems were negative   VITAL SIGNS:  Temp:  [97.7 F (36.5 C)-98.1 F (36.7 C)] 98.1 F (36.7 C) (05/05 1523) Pulse Rate:  [65-100] 80 (05/05 1523) Resp:  [12-20] 16 (05/05 1523) BP: (90-121)/(63-87) 104/77 (05/05 1523) SpO2:  [96 %-100 %] 100 % (05/05 1523) Weight:  [90.7 kg] 90.7 kg (05/05 0018)     Height: 5\' 11"  (180.3 cm) Weight: 90.7 kg BMI (Calculated): 27.91   INTAKE/OUTPUT:  This shift: Total I/O In: 1563.8 [I.V.:1561.4; IV Piggyback:2.4] Out: -   Last 2 shifts: @IOLAST2SHIFTS @   PHYSICAL EXAM:  Constitutional:  -- Normal body habitus  -- Awake, alert, and oriented x3  Eyes:  -- Pupils equally round and reactive to light  -- No scleral icterus  Ear, nose, and throat:  -- No jugular venous distension  Pulmonary:  -- No crackles  -- Equal breath sounds bilaterally -- Breathing non-labored at rest Cardiovascular:  -- S1, S2 present  -- No pericardial rubs Gastrointestinal:  -- Abdomen soft, nontender, non-distended, no guarding or rebound tenderness -- No abdominal masses appreciated, pulsatile or otherwise  Musculoskeletal and Integumentary:  -- Wounds: None appreciated -- Extremities: B/L UE and LE FROM, hands and feet warm, no edema  Neurologic:  -- Motor function: intact and symmetric -- Sensation: intact and symmetric   Labs:  CBC Latest Ref Rng & Units 03/06/2020 11/11/2019  WBC 4.0 - 10.5 K/uL 13.8(H) 9.0  Hemoglobin 12.0 - 15.0 g/dL 14.7 14.0  Hematocrit 36.0 - 46.0 % 41.7 41.4  Platelets 150 - 400 K/uL 362 340   CMP Latest Ref Rng & Units 03/06/2020  Glucose 70 - 99 mg/dL 111(H)  BUN 6 - 20 mg/dL 12  Creatinine 0.44 - 1.00 mg/dL 0.66  Sodium 135 - 145 mmol/L 134(L)  Potassium 3.5 - 5.1 mmol/L 3.8  Chloride 98 - 111 mmol/L 100  CO2 22 - 32 mmol/L 27  Calcium 8.9 - 10.3 mg/dL 9.3  Total Protein 6.5 - 8.1 g/dL 7.6  Total Bilirubin 0.3 - 1.2 mg/dL 1.1  Alkaline Phos 38 - 126 U/L 92  AST 15 - 41 U/L 107(H)  ALT 0 - 44 U/L 55(H)    Imaging  studies:  EXAM: MRI ABDOMEN WITHOUT AND WITH CONTRAST (INCLUDING MRCP)  TECHNIQUE: Multiplanar multisequence MR imaging of the abdomen was performed both before and after the administration of intravenous contrast. Heavily T2-weighted images of the biliary and pancreatic ducts were obtained, and three-dimensional MRCP images were rendered by post processing.  CONTRAST:  7.70mL GADAVIST GADOBUTROL 1 MMOL/ML IV SOLN  COMPARISON:  Same-day right upper quadrant ultrasound, 03/06/2020  FINDINGS: Lower chest: Trace pleural effusions.  Hepatobiliary: No mass or other parenchymal abnormality identified. Numerous gallstones in the gallbladder. There are multiple tiny gallstones in the distal common bile duct, measuring no greater than 2-3 mm in size (series 9, image 8).  Pancreas: No mass, inflammatory changes, or other parenchymal abnormality identified.  Spleen:  Within normal limits  in size and appearance.  Adrenals/Urinary Tract: No masses identified. No evidence of hydronephrosis.  Stomach/Bowel: Visualized portions within the abdomen are unremarkable.  Vascular/Lymphatic: No pathologically enlarged lymph nodes identified. No abdominal aortic aneurysm demonstrated.  Other:  None.  Musculoskeletal: No suspicious bone lesions identified.  IMPRESSION: 1. Numerous gallstones in the gallbladder. There are multiple tiny gallstones in the distal common bile duct, measuring no greater than 2-3 mm in size. No biliary ductal dilatation.  2. No gallbladder wall thickening or pericholecystic fluid. No inflammatory findings.   Electronically Signed   By: Alex  Bibbey M.D.   On: 03/06/2020 15:08   Assessment/Plan:  33 y.o. female with choledocholithiasis.  Abdominal pain, and images consistent with cholelithiasis with choledocholithiasis. Patient evaluated by GI service and MRCP was done confirming stones in the common bile duct. Planning to do ERCP tomorrow.  After ERCP, I recommend cholecystectomy before discharge hopefully the day after ERCP being cautious with possible pancreatitis after ERCP. Patient oriented about this recommendations and agreed to proceed with cholecystectomy during admission. I discuss with the patient the surgical intervention, the benefits and the risk including: bleeding, infection, bile leak, injury to common bile duct, abscess, injury to adjacent organs among others. She understood and agreed to proceed.   All of the above findings and recommendations were discussed with the patient and her family, and all of patient's and her family's questions were answered to their expressed satisfaction.  Marrion Accomando Cintrn-Daz, MD  

## 2020-03-06 NOTE — Progress Notes (Signed)
Pharmacy Antibiotic Note  Jennifer Meyers is a 34 y.o. female admitted on 03/06/2020 with cholecystitis and choledocolithiasis.  Pharmacy has been consulted for zosyn dosing.  Plan: Zosyn 3.375g IV q8h (4 hour infusion).  Height: 5\' 11"  (180.3 cm) Weight: 90.7 kg (200 lb) IBW/kg (Calculated) : 70.8  Temp (24hrs), Avg:97.7 F (36.5 C), Min:97.7 F (36.5 C), Max:97.7 F (36.5 C)  Recent Labs  Lab 03/06/20 0018  WBC 13.8*  CREATININE 0.66    Estimated Creatinine Clearance: 126.8 mL/min (by C-G formula based on SCr of 0.66 mg/dL).    Allergies  Allergen Reactions  . Sulfa Antibiotics Anaphylaxis   Thank you for allowing pharmacy to be a part of this patient's care.  05/06/20, PharmD, BCPS Clinical Pharmacist 03/06/2020 6:56 AM

## 2020-03-06 NOTE — Consult Note (Signed)
SURGICAL CONSULTATION NOTE   HISTORY OF PRESENT ILLNESS (HPI):  34 y.o. female presented to [facility] ED for evaluation of abdominal pain that started yesterday at 10 pm. Patient reports started on the epigastric area. Pain also radiates to right upper quadrant. No aggravating factors. Alleviating factor has been IV pain medications since admission. Denies fever or chills. Denies previous episodes of biliary colic.   At ED she had ultrasound of the abdomen showing cholelithiasis stones in the common bile duct. No CBD duct dilation. No sign of cholecystitis. Normal bilirubin and alkaline phosphatase. No WBC count. I personally evaluated the images and the labs  Surgery is consulted by Dr. Roxan Hockey in this context for evaluation and management of cholelithiasis with choledocholitiasis.  PAST MEDICAL HISTORY (PMH):  Past Medical History:  Diagnosis Date  . Anxiety   . Depression      PAST SURGICAL HISTORY (PSH):  Past Surgical History:  Procedure Laterality Date  . DILATION AND CURETTAGE OF UTERUS       MEDICATIONS:  Prior to Admission medications   Medication Sig Start Date End Date Taking? Authorizing Provider  citalopram (CELEXA) 20 MG tablet Take 20 mg by mouth daily. 02/02/20  Yes [provider]  ibuprofen (ADVIL) 800 MG tablet Take 800 mg by mouth 3 (three) times daily as needed for pain. 11/30/19  Yes [provider]  montelukast (SINGULAIR) 10 MG tablet Take 10 mg by mouth daily. 02/02/20  Yes [provider]  Prenatal MV-Min-Fe Fum-FA-DHA (VITAFOL-OB+DHA) 65-1 & 250 MG MISC Take 1 packet by mouth daily. 01/15/20  Yes [provider]     ALLERGIES:  Allergies  Allergen Reactions  . Sulfa Antibiotics Anaphylaxis     SOCIAL HISTORY:  Social History   Socioeconomic History  . Marital status: Single    Spouse name: Not on file  . Number of children: Not on file  . Years of education: Not on file  . Highest education level: Not on file   Occupational History  . Not on file  Tobacco Use  . Smoking status: Never Smoker  . Smokeless tobacco: Never Used  Substance and Sexual Activity  . Alcohol use: Not Currently  . Drug use: Never  . Sexual activity: Not on file  Other Topics Concern  . Not on file  Social History Narrative  . Not on file   Social Determinants of Health   Financial Resource Strain:   . Difficulty of Paying Living Expenses:   Food Insecurity:   . Worried About Programme researcher, broadcasting/film/video in the Last Year:   . Barista in the Last Year:   Transportation Needs:   . Freight forwarder (Medical):   Marland Kitchen Lack of Transportation (Non-Medical):   Physical Activity:   . Days of Exercise per Week:   . Minutes of Exercise per Session:   Stress:   . Feeling of Stress :   Social Connections:   . Frequency of Communication with Friends and Family:   . Frequency of Social Gatherings with Friends and Family:   . Attends Religious Services:   . Active Member of Clubs or Organizations:   . Attends Banker Meetings:   Marland Kitchen Marital Status:   Intimate Partner Violence:   . Fear of Current or Ex-Partner:   . Emotionally Abused:   Marland Kitchen Physically Abused:   . Sexually Abused:       FAMILY HISTORY:  History reviewed. No pertinent family history.   REVIEW OF SYSTEMS:  Constitutional: denies weight loss, fever, chills, or sweats  Eyes: denies any other vision changes, history of eye injury  ENT: denies sore throat, hearing problems  Respiratory: denies shortness of breath, wheezing  Cardiovascular: denies chest pain, palpitations  Gastrointestinal: positive abdominal pain, nausea and vomiting Genitourinary: denies burning with urination or urinary frequency Musculoskeletal: denies any other joint pains or cramps  Skin: denies any other rashes or skin discolorations  Neurological: denies any other headache, dizziness, weakness  Psychiatric: denies any other depression, anxiety   All other review  of systems were negative   VITAL SIGNS:  Temp:  [97.7 F (36.5 C)-98.1 F (36.7 C)] 98.1 F (36.7 C) (05/05 1523) Pulse Rate:  [65-100] 80 (05/05 1523) Resp:  [12-20] 16 (05/05 1523) BP: (90-121)/(63-87) 104/77 (05/05 1523) SpO2:  [96 %-100 %] 100 % (05/05 1523) Weight:  [90.7 kg] 90.7 kg (05/05 0018)     Height: 5\' 11"  (180.3 cm) Weight: 90.7 kg BMI (Calculated): 27.91   INTAKE/OUTPUT:  This shift: Total I/O In: 1563.8 [I.V.:1561.4; IV Piggyback:2.4] Out: -   Last 2 shifts: @IOLAST2SHIFTS @   PHYSICAL EXAM:  Constitutional:  -- Normal body habitus  -- Awake, alert, and oriented x3  Eyes:  -- Pupils equally round and reactive to light  -- No scleral icterus  Ear, nose, and throat:  -- No jugular venous distension  Pulmonary:  -- No crackles  -- Equal breath sounds bilaterally -- Breathing non-labored at rest Cardiovascular:  -- S1, S2 present  -- No pericardial rubs Gastrointestinal:  -- Abdomen soft, nontender, non-distended, no guarding or rebound tenderness -- No abdominal masses appreciated, pulsatile or otherwise  Musculoskeletal and Integumentary:  -- Wounds: None appreciated -- Extremities: B/L UE and LE FROM, hands and feet warm, no edema  Neurologic:  -- Motor function: intact and symmetric -- Sensation: intact and symmetric   Labs:  CBC Latest Ref Rng & Units 03/06/2020 11/11/2019  WBC 4.0 - 10.5 K/uL 13.8(H) 9.0  Hemoglobin 12.0 - 15.0 g/dL 14.7 14.0  Hematocrit 36.0 - 46.0 % 41.7 41.4  Platelets 150 - 400 K/uL 362 340   CMP Latest Ref Rng & Units 03/06/2020  Glucose 70 - 99 mg/dL 111(H)  BUN 6 - 20 mg/dL 12  Creatinine 0.44 - 1.00 mg/dL 0.66  Sodium 135 - 145 mmol/L 134(L)  Potassium 3.5 - 5.1 mmol/L 3.8  Chloride 98 - 111 mmol/L 100  CO2 22 - 32 mmol/L 27  Calcium 8.9 - 10.3 mg/dL 9.3  Total Protein 6.5 - 8.1 g/dL 7.6  Total Bilirubin 0.3 - 1.2 mg/dL 1.1  Alkaline Phos 38 - 126 U/L 92  AST 15 - 41 U/L 107(H)  ALT 0 - 44 U/L 55(H)    Imaging  studies:  EXAM: MRI ABDOMEN WITHOUT AND WITH CONTRAST (INCLUDING MRCP)  TECHNIQUE: Multiplanar multisequence MR imaging of the abdomen was performed both before and after the administration of intravenous contrast. Heavily T2-weighted images of the biliary and pancreatic ducts were obtained, and three-dimensional MRCP images were rendered by post processing.  CONTRAST:  7.70mL GADAVIST GADOBUTROL 1 MMOL/ML IV SOLN  COMPARISON:  Same-day right upper quadrant ultrasound, 03/06/2020  FINDINGS: Lower chest: Trace pleural effusions.  Hepatobiliary: No mass or other parenchymal abnormality identified. Numerous gallstones in the gallbladder. There are multiple tiny gallstones in the distal common bile duct, measuring no greater than 2-3 mm in size (series 9, image 8).  Pancreas: No mass, inflammatory changes, or other parenchymal abnormality identified.  Spleen:  Within normal limits  in size and appearance.  Adrenals/Urinary Tract: No masses identified. No evidence of hydronephrosis.  Stomach/Bowel: Visualized portions within the abdomen are unremarkable.  Vascular/Lymphatic: No pathologically enlarged lymph nodes identified. No abdominal aortic aneurysm demonstrated.  Other:  None.  Musculoskeletal: No suspicious bone lesions identified.  IMPRESSION: 1. Numerous gallstones in the gallbladder. There are multiple tiny gallstones in the distal common bile duct, measuring no greater than 2-3 mm in size. No biliary ductal dilatation.  2. No gallbladder wall thickening or pericholecystic fluid. No inflammatory findings.   Electronically Signed   By: Lauralyn Primes M.D.   On: 03/06/2020 15:08   Assessment/Plan:  34 y.o. female with choledocholithiasis.  Abdominal pain, and images consistent with cholelithiasis with choledocholithiasis. Patient evaluated by GI service and MRCP was done confirming stones in the common bile duct. Planning to do ERCP tomorrow.  After ERCP, I recommend cholecystectomy before discharge hopefully the day after ERCP being cautious with possible pancreatitis after ERCP. Patient oriented about this recommendations and agreed to proceed with cholecystectomy during admission. I discuss with the patient the surgical intervention, the benefits and the risk including: bleeding, infection, bile leak, injury to common bile duct, abscess, injury to adjacent organs among others. She understood and agreed to proceed.   All of the above findings and recommendations were discussed with the patient and her family, and all of patient's and her family's questions were answered to their expressed satisfaction.  Gae Gallop, MD

## 2020-03-06 NOTE — ED Notes (Signed)
Pt ambulatory on own power to toilet at this time. No distress noted. Husband remains at bedside

## 2020-03-06 NOTE — ED Provider Notes (Signed)
[facility] Emergency Department Provider Note  ____________________________________________   First MD Initiated Contact with Patient 03/06/20 [phone]     (approximate)  I have reviewed the triage vital signs and the nursing notes.   HISTORY  Chief Complaint Abdominal Pain    HPI Jennifer Meyers is a 34 y.o. female with below list of previous medical conditions presents to the emergency department secondary to  acute onset of 10 out of 10 right upper quadrant abdominal pain with radiation to the back.  Patient states that the pain began 2 hours before arrival.  Patient also admits to nausea no vomiting.  Patient denies any fever no diarrhea constipation.  Patient denies any urinary symptoms.  Patient denies any aggravating or alleviating factors for her pain.       Past Medical History:  Diagnosis Date  . Anxiety   . Depression       Past Surgical History:  Procedure Laterality Date  . DILATION AND CURETTAGE OF UTERUS      Prior to Admission medications   Medication Sig Start Date End Date Taking? Authorizing Provider  citalopram (CELEXA) 20 MG tablet Take 20 mg by mouth daily. 02/02/20  Yes [provider]  ibuprofen (ADVIL) 800 MG tablet Take 800 mg by mouth 3 (three) times daily as needed for pain. 11/30/19  Yes [provider]  montelukast (SINGULAIR) 10 MG tablet Take 10 mg by mouth daily. 02/02/20  Yes [provider]  Prenatal MV-Min-Fe Fum-FA-DHA (VITAFOL-OB+DHA) 65-1 & 250 MG MISC Take 1 packet by mouth daily. 01/15/20  Yes [provider]    Allergies Sulfa antibiotics  No family history on file.  Social History Social History   Tobacco Use  . Smoking status: Never Smoker  . Smokeless tobacco: Never Used  Substance Use Topics  . Alcohol use: Not Currently  . Drug use: Never    Review of Systems Constitutional: No fever/chills Eyes: No visual changes. ENT: No sore throat. Cardiovascular:  Denies chest pain. Respiratory: Denies shortness of breath. Gastrointestinal: Positive for abdominal pain.  And nausea, no vomiting.  No diarrhea.  No constipation. Genitourinary: Negative for dysuria. Musculoskeletal: Negative for neck pain.  Negative for back pain. Integumentary: Negative for rash. Neurological: Negative for headaches, focal weakness or numbness.  ____________________________________________   PHYSICAL EXAM:  VITAL SIGNS: ED Triage Vitals  Enc Vitals Group     BP 03/06/20 0018 109/76     Pulse Rate 03/06/20 0018 94     Resp 03/06/20 0219 20     Temp 03/06/20 0018 97.7 F (36.5 C)     Temp Source 03/06/20 0018 Oral     SpO2 03/06/20 0018 97 %     Weight 03/06/20 0018 90.7 kg (200 lb)     Height 03/06/20 0018 1.803 m ('5\' 11"' )     Head Circumference --      Peak Flow --      Pain Score 03/06/20 0029 8     Pain Loc --      Pain Edu? --      Excl. in [place]? --     Constitutional: Alert and oriented.  Apparent discomfort Eyes: Conjunctivae are normal.  Mouth/Throat: Patient is wearing a mask. Neck: No stridor.  No meningeal signs.   Cardiovascular: Normal rate, regular rhythm. Good peripheral circulation. Grossly normal heart sounds. Respiratory: Normal respiratory effort.  No retractions. Gastrointestinal: Soft and nontender. No distention.  Musculoskeletal: No lower extremity tenderness nor edema. No gross  deformities of extremities. Neurologic:  Normal speech and language. No gross focal neurologic deficits are appreciated.  Skin:  Skin is warm, dry and intact. Psychiatric: Mood and affect are normal. Speech and behavior are normal.  ____________________________________________   LABS (all labs ordered are listed, but only abnormal results are displayed)  Labs Reviewed  CBC WITH DIFFERENTIAL/PLATELET - Abnormal; Notable for the following components:      Result Value   WBC 13.8 (*)    Neutro Abs 9.8 (*)    All other components within normal limits   COMPREHENSIVE METABOLIC PANEL - Abnormal; Notable for the following components:   Sodium 134 (*)    Glucose, Bld 111 (*)    AST 107 (*)    ALT 55 (*)    All other components within normal limits  URINALYSIS, COMPLETE (UACMP) WITH MICROSCOPIC - Abnormal; Notable for the following components:   Color, Urine YELLOW (*)    APPearance CLOUDY (*)    pH 9.0 (*)    Protein, ur 30 (*)    All other components within normal limits  RESPIRATORY PANEL BY RT PCR (FLU A&B, COVID)  LIPASE, BLOOD  PREGNANCY, URINE  HIV ANTIBODY (ROUTINE TESTING W REFLEX)  TROPONIN I (HIGH SENSITIVITY)   ____________________________________________  EKG  ED ECG REPORT I, Holland N Coolidge Gossard, the attending physician, personally viewed and interpreted this ECG.   Date: 03/06/2020  EKG Time: 12:21 AM  Rate: 93  Rhythm: Normal sinus rhythm  Axis: Normal  Intervals: Normal  ST&T Change: None  ____________________________________________  RADIOLOGY I, [place] N Tanishi Nault, personally viewed and evaluated these images (plain radiographs) as part of my medical decision making, as well as reviewing the written report by the radiologist.  ED MD interpretation: Cholelithiasis and choledocholithiasis without any evidence of acute cholecystitis on ultrasound per radiologist  Official radiology report(s): US ABDOMEN LIMITED RUQ  Result Date: 03/06/2020 CLINICAL DATA:  Right upper quadrant pain EXAM: ULTRASOUND ABDOMEN LIMITED RIGHT UPPER QUADRANT COMPARISON:  None. FINDINGS: Gallbladder: Layering and shadowing gallstones. No wall thickening or focal tenderness. Common bile duct: Diameter: 5 mm. Although normal diameter there is visible echogenic foci consistent with calculi measuring up to 3 mm. Liver: No focal lesion identified. Within normal limits in parenchymal echogenicity. Portal vein is patent on color Doppler imaging with normal direction of blood flow towards the liver. IMPRESSION: 1. Cholelithiasis and  choledocholithiasis. 2. No evidence of acute cholecystitis. Electronically Signed   By: Monte Fantasia M.D.   On: 03/06/2020 04:06     Procedures   ____________________________________________   INITIAL IMPRESSION / MDM / ASSESSMENT AND PLAN / ED COURSE  As part of my medical decision making, I reviewed the following data within the electronic MEDICAL RECORD NUMBER   34 year old female presented with above-stated history and physical exam with differential diagnosis including but not limited to cholelithiasis choledocholithiasis cholecystitis and pancreatitis.  Patient given IV morphine 4 mg and Zofran 4 mg with improvement in pain.  Ultrasound did reveal evidence of cholelithiasis and choledocholithiasis.  Patient does have a white blood cell count of 13.8, AST 107 ALT 55 bilirubin normal at 1.1 alk phos 92 and is afebrile.  Patient discussed with Dr. Marius Ditch gastroenterology as well as Dr. Peyton Najjar general surgery.  Patient admitted to Dr. Damita Dunnings hospitalist  ____________________________________________  FINAL CLINICAL IMPRESSION(S) / ED DIAGNOSES  Final diagnoses:  RUQ pain  Calculus of gallbladder without cholecystitis without obstruction  Choledocholithiasis     MEDICATIONS GIVEN DURING THIS VISIT:  Medications  0.9 %  sodium chloride infusion ( Intravenous New Bag/Given 03/06/20 0541)  ketorolac (TORADOL) 30 MG/ML injection 30 mg (has no administration in time range)  morphine 2 MG/ML injection 2 mg (has no administration in time range)  ondansetron (ZOFRAN) tablet 4 mg (has no administration in time range)    Or  ondansetron (ZOFRAN) injection 4 mg (has no administration in time range)  morphine 4 MG/ML injection 4 mg ( Intravenous Not Given 03/06/20 0249)  ondansetron (ZOFRAN) injection 4 mg ( Intravenous Not Given 03/06/20 0248)  morphine 2 MG/ML injection 2 mg (2 mg Intravenous Given 03/06/20 0352)  ampicillin-sulbactam (UNASYN) 1.5 g in sodium chloride 0.9 % 100 mL IVPB (0 g  Intravenous Stopped 03/06/20 5621)     ED Discharge Orders    None      *Please note:  Danella N. Andree Meyers was evaluated in Emergency Department on 03/06/2020 for the symptoms described in the history of present illness. She was evaluated in the context of the global COVID-19 pandemic, which necessitated consideration that the patient might be at risk for infection with the SARS-CoV-2 virus that causes COVID-19. Institutional protocols and algorithms that pertain to the evaluation of patients at risk for COVID-19 are in a state of rapid change based on information released by regulatory bodies including the CDC and federal and state organizations. These policies and algorithms were followed during the patient's care in the ED.  Some ED evaluations and interventions may be delayed as a result of limited staffing during the pandemic.*  Note:  This document was prepared using Dragon voice recognition software and may include unintentional dictation errors.   Gregor Hams, MD 03/06/20 [phone]

## 2020-03-06 NOTE — Progress Notes (Signed)
PROGRESS NOTE  Jennifer Meyers SAY:[id number] DOB: 1985-11-24 DOA: 03/06/2020 PCP: Adin Hector, MD  Brief History   The patient is a 34 yr old woman who presented to [facility] with complaints of epigastric abdominal pain that radiates to her mid-back on 03/05/2020. She denies nausea and vomiting, fever or chills. She was found to have evidence of cholelithiasis and cholecystitis on abdominal ultrasound. LFT's were elevated as was her WBC.   Gastroenterology was consulted. They recommended an MRCP. This was performed and demonstrated numerous gallstones in the gallbladder as well as multiple stones in the CBD which were very small in size. There was no biliary ductal dilatation. There was no gallbladder wall thickening and no peripholecystic fluid and no inflammatory findings.  The patient has been started on IV Zosyn. She will be kept NPO after midnight for ERCP.   Consultants  . Gastroenterology . General Surgery  Procedures  . MRCP  Antibiotics   Anti-infectives (From admission, onward)   Start     Dose/Rate Route Frequency Ordered Stop   03/06/20 1400  piperacillin-tazobactam (ZOSYN) IVPB 3.375 g     3.375 g 12.5 mL/hr over 240 Minutes Intravenous Every 8 hours 03/06/20 0656     03/06/20 0430  ampicillin-sulbactam (UNASYN) 1.5 g in sodium chloride 0.9 % 100 mL IVPB     1.5 g 200 mL/hr over 30 Minutes Intravenous  Once 03/06/20 0418 03/06/20 0508    .   Subjective  The patient states that she is feeling a little better, although she continues to have significant abdominal pain.  Objective   Vitals:  Vitals:   03/06/20 1100 03/06/20 1523  BP: 103/63 104/77  Pulse: 70 80  Resp: 15 16  Temp: 98 F (36.7 C) 98.1 F (36.7 C)  SpO2: 99% 100%   Exam:  Constitutional:  . The patient is awake, alert, and oriented x 3. No acute distress. Respiratory:  . No increased work of breathing. . No wheezes, rales, or rhonchi . No tactile fremitus Cardiovascular:  . Regular  rate and rhythm . No murmurs, ectopy, or gallups. . No lateral PMI. No thrills. Abdomen:  . Abdomen is soft, slightly distended and tender to palpation. . No hernias, masses, or organomegaly . Normoactive bowel sounds.  Musculoskeletal:  . No cyanosis, clubbing, or edema Skin:  . No rashes, lesions, ulcers . palpation of skin: no induration or nodules Neurologic:  . CN 2-12 intact . Sensation all 4 extremities intact Psychiatric:  . Mental status o Mood, affect appropriate o Orientation to person, place, time  . judgment and insight appear intact  I have personally reviewed the following:   Today's Data  . Vitals, CMP, CBC  Imaging  . MRCP  Scheduled Meds: Continuous Infusions: . sodium chloride 125 mL/hr at 03/06/20 1536  . piperacillin-tazobactam (ZOSYN)  IV 3.375 g (03/06/20 1354)    Active Problems:   Choledocholithiasis   Biliary colic Abdominal pain Elevated LFT's Leukocytosis   LOS: 0 days   A & P  Choledocholithiasis: NPO for ERCP in the morning with GI. General surgery has been consulted. She is receiving IV Zosyn.   Abdominal pain: Pain control.  Elevated LFT's: Due to choledocholithiasis. Monitor.  Leukocytosis: Due to choledocholithiasis. Monitor.   I have seen and examined this patient myself. I have spent 35 minutes   DVT Prophylaxis: SCD's CODE STATUS: Full Code Family Communication: Spouse at bedside Disposition: The patient is from home. Anticipate discharge to home. Barriers to discharge: Resolution of  choledocholithiasis.  Zakirah Weingart, DO [facility] Direct contact: see [email or web address]  7PM-7AM contact night coverage as above 03/06/2020, 6:49 PM  LOS: 0 days

## 2020-03-06 NOTE — H&P (Signed)
History and Physical    Jennifer Meyers ZGY:[id number] DOB: 1986-06-16 DOA: 03/06/2020  PCP: Adin Hector, MD   Patient coming from: Home I have personally briefly reviewed patient's old medical records in [place]  Chief Complaint: Abdominal pain  HPI: Jennifer Meyers is a 34 y.o. female with no significant past medical history who presents to the emergency room with sudden onset severe right abdominal pain radiating to the back around the right waking her from sleep.  She denies nausea vomiting fever or chills.  Denies cough shortness of breath.  Denies prior episode of the same.  ED Course: Upon arrival she was in acute pain distress.  Vitals were within normal limits.  Blood work significant for WBC of 14,000 elevated AST of 107 and ALT of 55.  Abdominal sonogram showed cholelithiasis and choledocholithiasis without evidence of acute cholecystitis.  CBD was 5 mm with a 3 mm calculus.  The emergency room provider spoke with both GI and surgery who will see patient in consultation  Review of Systems: As per HPI otherwise 10 point review of systems negative.    Past Medical History:  Diagnosis Date  . Anxiety   . Depression     Past Surgical History:  Procedure Laterality Date  . DILATION AND CURETTAGE OF UTERUS       reports that she has never smoked. She has never used smokeless tobacco. She reports previous alcohol use. She reports that she does not use drugs.  Allergies  Allergen Reactions  . Sulfa Antibiotics     No family history on file.   Prior to Admission medications   Not on File    Physical Exam: Vitals:   03/06/20 0018 03/06/20 0219 03/06/20 0430  BP: 109/76 121/87 92/78  Pulse: 94 82 100  Resp:  20 17  Temp: 97.7 F (36.5 C)    TempSrc: Oral    SpO2: 97% 100% 97%  Weight: 90.7 kg    Height: 5\' 11"  (1.803 m)       Vitals:   03/06/20 0018 03/06/20 0219 03/06/20 0430  BP: 109/76 121/87 92/78  Pulse: 94 82 100  Resp:  20 17   Temp: 97.7 F (36.5 C)    TempSrc: Oral    SpO2: 97% 100% 97%  Weight: 90.7 kg    Height: 5\' 11"  (1.803 m)      Constitutional: Alert and awake, oriented x3, not in any acute distress. Eyes: PERLA, EOMI, irises appear normal, anicteric sclera,  ENMT: external ears and nose appear normal, normal hearing             Lips appears normal, oropharynx mucosa, tongue, posterior pharynx appear normal  Neck: neck appears normal, no masses, normal ROM, no thyromegaly, no JVD  CVS: S1-S2 clear, no murmur rubs or gallops,  , no carotid bruits, pedal pulses palpable, No LE edema Respiratory:  clear to auscultation bilaterally, no wheezing, rales or rhonchi. Respiratory effort normal. No accessory muscle use.  Abdomen: tender in RUQ, nondistended, normal bowel sounds, no hepatosplenomegaly, no hernias Musculoskeletal: : no cyanosis, clubbing , no contractures or atrophy Neuro: Cranial nerves II-XII intact, sensation, reflexes normal, strength Psych: judgement and insight appear normal, stable mood and affect,  Skin: no rashes or lesions or ulcers, no induration or nodules   Labs on Admission: I have personally reviewed following labs and imaging studies  CBC: Recent Labs  Lab 03/06/20 0018  WBC 13.8*  NEUTROABS 9.8*  HGB 14.7  HCT  41.7  MCV 84.9  PLT 362   Basic Metabolic Panel: Recent Labs  Lab 03/06/20 0018  NA 134*  K 3.8  CL 100  CO2 27  GLUCOSE 111*  BUN 12  CREATININE 0.66  CALCIUM 9.3   GFR: Estimated Creatinine Clearance: 126.8 mL/min (by C-G formula based on SCr of 0.66 mg/dL). Liver Function Tests: Recent Labs  Lab 03/06/20 0018  AST 107*  ALT 55*  ALKPHOS 92  BILITOT 1.1  PROT 7.6  ALBUMIN 4.2   Recent Labs  Lab 03/06/20 0018  LIPASE 30   No results for input(s): AMMONIA in the last 168 hours. Coagulation Profile: No results for input(s): INR, PROTIME in the last 168 hours. Cardiac Enzymes: No results for input(s): CKTOTAL, CKMB, CKMBINDEX,  TROPONINI in the last 168 hours. BNP (last 3 results) No results for input(s): PROBNP in the last 8760 hours. HbA1C: No results for input(s): HGBA1C in the last 72 hours. CBG: No results for input(s): GLUCAP in the last 168 hours. Lipid Profile: No results for input(s): CHOL, HDL, LDLCALC, TRIG, CHOLHDL, LDLDIRECT in the last 72 hours. Thyroid Function Tests: No results for input(s): TSH, T4TOTAL, FREET4, T3FREE, THYROIDAB in the last 72 hours. Anemia Panel: No results for input(s): VITAMINB12, FOLATE, FERRITIN, TIBC, IRON, RETICCTPCT in the last 72 hours. Urine analysis:    Component Value Date/Time   COLORURINE YELLOW (A) 03/06/2020 0018   APPEARANCEUR CLOUDY (A) 03/06/2020 0018   LABSPEC 1.018 03/06/2020 0018   PHURINE 9.0 (H) 03/06/2020 0018   GLUCOSEU NEGATIVE 03/06/2020 0018   HGBUR NEGATIVE 03/06/2020 0018   BILIRUBINUR NEGATIVE 03/06/2020 0018   KETONESUR NEGATIVE 03/06/2020 0018   PROTEINUR 30 (A) 03/06/2020 0018   NITRITE NEGATIVE 03/06/2020 0018   LEUKOCYTESUR NEGATIVE 03/06/2020 0018    Radiological Exams on Admission: US ABDOMEN LIMITED RUQ  Result Date: 03/06/2020 CLINICAL DATA:  Right upper quadrant pain EXAM: ULTRASOUND ABDOMEN LIMITED RIGHT UPPER QUADRANT COMPARISON:  None. FINDINGS: Gallbladder: Layering and shadowing gallstones. No wall thickening or focal tenderness. Common bile duct: Diameter: 5 mm. Although normal diameter there is visible echogenic foci consistent with calculi measuring up to 3 mm. Liver: No focal lesion identified. Within normal limits in parenchymal echogenicity. Portal vein is patent on color Doppler imaging with normal direction of blood flow towards the liver. IMPRESSION: 1. Cholelithiasis and choledocholithiasis. 2. No evidence of acute cholecystitis. Electronically Signed   By: Marnee Spring M.D.   On: 03/06/2020 04:06    EKG: Independently reviewed.   Assessment/Plan Active Problems:   Choledocholithiasis   Biliary  colic -Patient presents with sudden onset acute abdominal pain but without nausea vomiting with ultrasound showing a 3 mm stone in the CBD without biliary dilatation -Keep n.p.o. -IV pain medication, IV antiemetics and IV fluids --Zosyn -Consult GI and surgery    DVT prophylaxis: SCD Code Status: full code  Family Communication:  none  Disposition Plan: Back to previous home environment Consults called: GI and surgery  Status:obs    Andris Baumann MD [facility]     03/06/2020, 4:47 AM

## 2020-03-07 ENCOUNTER — Encounter
Admission: EM | Disposition: A | Discharge status: Home / Self Care | Payer: Self-pay | Source: Home / Self Care | Attending: Internal Medicine

## 2020-03-07 ENCOUNTER — Observation Stay: Payer: BC Managed Care – PPO | Admitting: Anesthesiology

## 2020-03-07 ENCOUNTER — Observation Stay: Payer: BC Managed Care – PPO

## 2020-03-07 DIAGNOSIS — Z881 Allergy status to other antibiotic agents status: Secondary | ICD-10-CM | POA: Diagnosis not present

## 2020-03-07 DIAGNOSIS — K8042 Calculus of bile duct with acute cholecystitis without obstruction: Principal | ICD-10-CM

## 2020-03-07 DIAGNOSIS — K8050 Calculus of bile duct without cholangitis or cholecystitis without obstruction: Secondary | ICD-10-CM | POA: Diagnosis present

## 2020-03-07 DIAGNOSIS — F329 Major depressive disorder, single episode, unspecified: Secondary | ICD-10-CM | POA: Diagnosis present

## 2020-03-07 DIAGNOSIS — F419 Anxiety disorder, unspecified: Secondary | ICD-10-CM | POA: Diagnosis present

## 2020-03-07 DIAGNOSIS — K219 Gastro-esophageal reflux disease without esophagitis: Secondary | ICD-10-CM | POA: Diagnosis present

## 2020-03-07 DIAGNOSIS — Z79899 Other long term (current) drug therapy: Secondary | ICD-10-CM | POA: Diagnosis not present

## 2020-03-07 DIAGNOSIS — Z20822 Contact with and (suspected) exposure to covid-19: Secondary | ICD-10-CM | POA: Diagnosis present

## 2020-03-07 HISTORY — PX: ERCP: SHX5425

## 2020-03-07 LAB — COMPREHENSIVE METABOLIC PANEL
ALT: 247 U/L — ABNORMAL HIGH (ref 0–44)
AST: 159 U/L — ABNORMAL HIGH (ref 15–41)
Albumin: 3.4 g/dL — ABNORMAL LOW (ref 3.5–5.0)
Alkaline Phosphatase: 102 U/L (ref 38–126)
Anion gap: 5 (ref 5–15)
BUN: 13 mg/dL (ref 6–20)
CO2: 23 mmol/L (ref 22–32)
Calcium: 8 mg/dL — ABNORMAL LOW (ref 8.9–10.3)
Chloride: 110 mmol/L (ref 98–111)
Creatinine, Ser: 0.75 mg/dL (ref 0.44–1.00)
GFR calc Af Amer: >60 mL/min (ref >60)
GFR calc non Af Amer: >60 mL/min (ref >60)
Glucose, Bld: 86 mg/dL (ref 70–99)
Potassium: 3.5 mmol/L (ref 3.5–5.1)
Sodium: 138 mmol/L (ref 135–145)
Total Bilirubin: 2 mg/dL — ABNORMAL HIGH (ref 0.3–1.2)
Total Protein: 6.3 g/dL — ABNORMAL LOW (ref 6.5–8.1)

## 2020-03-07 LAB — CBC WITH DIFFERENTIAL/PLATELET
Abs Immature Granulocytes: 0.03 10*3/uL (ref 0.00–0.07)
Basophils Absolute: 0.1 10*3/uL (ref 0.0–0.1)
Basophils Relative: 1 %
Eosinophils Absolute: 0.2 10*3/uL (ref 0.0–0.5)
Eosinophils Relative: 3 %
HCT: 36.3 % (ref 36.0–46.0)
Hemoglobin: 12.6 g/dL (ref 12.0–15.0)
Immature Granulocytes: 0 %
Lymphocytes Relative: 30 %
Lymphs Abs: 2.2 10*3/uL (ref 0.7–4.0)
MCH: 29.7 pg (ref 26.0–34.0)
MCHC: 34.7 g/dL (ref 30.0–36.0)
MCV: 85.6 fL (ref 80.0–100.0)
Monocytes Absolute: 0.5 10*3/uL (ref 0.1–1.0)
Monocytes Relative: 8 %
Neutro Abs: 4.2 10*3/uL (ref 1.7–7.7)
Neutrophils Relative %: 58 %
Platelets: 285 10*3/uL (ref 150–400)
RBC: 4.24 MIL/uL (ref 3.87–5.11)
RDW: 12.9 % (ref 11.5–15.5)
WBC: 7.2 10*3/uL (ref 4.0–10.5)
nRBC: 0 % (ref 0.0–0.2)

## 2020-03-07 SURGERY — ERCP, WITH INTERVENTION IF INDICATED
Anesthesia: General

## 2020-03-07 MED ORDER — SUCCINYLCHOLINE CHLORIDE 200 MG/10ML IV SOSY
PREFILLED_SYRINGE | INTRAVENOUS | Status: AC
  Filled 2020-03-07: qty 10

## 2020-03-07 MED ORDER — ROCURONIUM BROMIDE 10 MG/ML (PF) SYRINGE
PREFILLED_SYRINGE | INTRAVENOUS | Status: AC
  Filled 2020-03-07: qty 10

## 2020-03-07 MED ORDER — INDOMETHACIN 50 MG RE SUPP
100.0000 mg | Freq: Once | RECTAL | Status: AC
  Administered 2020-03-07: 11:00:00 100 mg via RECTAL

## 2020-03-07 MED ORDER — FENTANYL CITRATE (PF) 100 MCG/2ML IJ SOLN
INTRAMUSCULAR | Status: DC | PRN
  Administered 2020-03-07 (×2): 25 ug via INTRAVENOUS
  Administered 2020-03-07: 50 ug via INTRAVENOUS

## 2020-03-07 MED ORDER — PROPOFOL 10 MG/ML IV BOLUS
INTRAVENOUS | Status: DC | PRN
  Administered 2020-03-07: 30 mg via INTRAVENOUS
  Administered 2020-03-07: 200 mg via INTRAVENOUS

## 2020-03-07 MED ORDER — SODIUM CHLORIDE 0.9 % IV SOLN: INTRAVENOUS | Status: DC

## 2020-03-07 MED ORDER — ONDANSETRON HCL 4 MG/2ML IJ SOLN
INTRAMUSCULAR | Status: AC
  Filled 2020-03-07: qty 2

## 2020-03-07 MED ORDER — MIDAZOLAM HCL 2 MG/2ML IJ SOLN
INTRAMUSCULAR | Status: AC
  Filled 2020-03-07: qty 2

## 2020-03-07 MED ORDER — INDOMETHACIN 50 MG RE SUPP
RECTAL | Status: AC
  Filled 2020-03-07: qty 2

## 2020-03-07 MED ORDER — ONDANSETRON HCL 4 MG/2ML IJ SOLN
INTRAMUSCULAR | Status: DC | PRN
  Administered 2020-03-07: 4 mg via INTRAVENOUS

## 2020-03-07 MED ORDER — SODIUM CHLORIDE 0.9 % IV SOLN
INTRAVENOUS | Status: DC | PRN
  Administered 2020-03-07 – 2020-03-09 (×3): 250 mL via INTRAVENOUS

## 2020-03-07 MED ORDER — SUCCINYLCHOLINE CHLORIDE 20 MG/ML IJ SOLN
INTRAMUSCULAR | Status: DC | PRN
  Administered 2020-03-07: 100 mg via INTRAVENOUS

## 2020-03-07 MED ORDER — ONDANSETRON HCL 4 MG/2ML IJ SOLN: 4.0000 mg | Freq: Once | INTRAMUSCULAR | Status: DC | PRN

## 2020-03-07 MED ORDER — LIDOCAINE HCL (CARDIAC) PF 100 MG/5ML IV SOSY
PREFILLED_SYRINGE | INTRAVENOUS | Status: DC | PRN
  Administered 2020-03-07: 50 mg via INTRAVENOUS

## 2020-03-07 MED ORDER — PROPOFOL 10 MG/ML IV BOLUS
INTRAVENOUS | Status: AC
  Filled 2020-03-07: qty 20

## 2020-03-07 MED ORDER — FENTANYL CITRATE (PF) 100 MCG/2ML IJ SOLN
INTRAMUSCULAR | Status: AC
  Filled 2020-03-07: qty 2

## 2020-03-07 MED ORDER — MIDAZOLAM HCL 2 MG/2ML IJ SOLN
INTRAMUSCULAR | Status: DC | PRN
  Administered 2020-03-07: 2 mg via INTRAVENOUS

## 2020-03-07 MED ORDER — LACTATED RINGERS IV SOLN
INTRAVENOUS | Status: DC
  Administered 2020-03-07: 1000 mL via INTRAVENOUS

## 2020-03-07 NOTE — Anesthesia Procedure Notes (Signed)
Procedure Name: Intubation Date/Time: 03/07/2020 10:19 AM Performed by: Corinda Gubler, MD Pre-anesthesia Checklist: Patient identified, Patient being monitored, Timeout performed, Emergency Drugs available and Suction available Patient Re-evaluated:Patient Re-evaluated prior to induction Oxygen Delivery Method: Circle system utilized Preoxygenation: Pre-oxygenation with 100% oxygen Induction Type: IV induction Laryngoscope Size: 3 and McGraph Grade View: Grade I Tube type: Oral Tube size: 7.0 mm Number of attempts: 1 Airway Equipment and Method: Stylet Placement Confirmation: ETT inserted through vocal cords under direct vision,  positive ETCO2 and breath sounds checked- equal and bilateral Secured at: 21 cm Tube secured with: Tape Dental Injury: Teeth and Oropharynx as per pre-operative assessment  Comments: MV not attempted

## 2020-03-07 NOTE — Progress Notes (Signed)
PROGRESS NOTE  Jennifer Meyers YWV:[id number] DOB: 1986-06-05 DOA: 03/06/2020 PCP: Adin Hector, MD  Brief History   The patient is a 34 yr old woman who presented to [facility] with complaints of epigastric abdominal pain that radiates to her mid-back on 03/05/2020. She denies nausea and vomiting, fever or chills. She was found to have evidence of cholelithiasis and cholecystitis on abdominal ultrasound. LFT's were elevated as was her WBC.   Gastroenterology was consulted. They recommended an MRCP. This was performed and demonstrated numerous gallstones in the gallbladder as well as multiple stones in the CBD which were very small in size. There was no biliary ductal dilatation. There was no gallbladder wall thickening and no peripholecystic fluid and no inflammatory findings.  The patient has been started on IV Zosyn. She has been evaluated by GI. She went for EGD on 03/07/2020. CBD was cleared of sludge. Plan is for the patient to go for cholecystectomy with general surgery in the morning. NPO after midnight.   Consultants  . Gastroenterology . General Surgery  Procedures  . MRCP  Antibiotics   Anti-infectives (From admission, onward)   Start     Dose/Rate Route Frequency Ordered Stop   03/06/20 1400  piperacillin-tazobactam (ZOSYN) IVPB 3.375 g     3.375 g 12.5 mL/hr over 240 Minutes Intravenous Every 8 hours 03/06/20 0656     03/06/20 0430  ampicillin-sulbactam (UNASYN) 1.5 g in sodium chloride 0.9 % 100 mL IVPB     1.5 g 200 mL/hr over 30 Minutes Intravenous  Once 03/06/20 0418 03/06/20 0508      Subjective  The patient is seen after her procedure. She states that she is still not feeling very well. No new complaints.  Objective   Vitals:  Vitals:   03/07/20 1142 03/07/20 1451  BP: 103/62 101/61  Pulse: 66 69  Resp: 16 14  Temp: 97.8 F (36.6 C) 98 F (36.7 C)  SpO2: 98% 100%   Exam:  Constitutional:  . The patient is somewhat somnolent and oriented x 3. No acute  distress. Respiratory:  . No increased work of breathing. . No wheezes, rales, or rhonchi . No tactile fremitus Cardiovascular:  . Regular rate and rhythm . No murmurs, ectopy, or gallups. . No lateral PMI. No thrills. Abdomen:  . Abdomen is soft, slightly distended and tender to palpation. . No hernias, masses, or organomegaly . Normoactive bowel sounds.  Musculoskeletal:  . No cyanosis, clubbing, or edema Skin:  . No rashes, lesions, ulcers . palpation of skin: no induration or nodules Neurologic:  . CN 2-12 intact . Sensation all 4 extremities intact Psychiatric:  . Mental status o Mood, affect appropriate o Orientation to person, place, time  . judgment and insight appear intact  I have personally reviewed the following:   Today's Data  . Vitals, CMP, CBC  Imaging  . MRCP  Procedures: ERCP 03/07/2020  Scheduled Meds: Continuous Infusions: . sodium chloride 125 mL/hr at 03/07/20 0604  . sodium chloride 250 mL (03/07/20 1558)  . piperacillin-tazobactam (ZOSYN)  IV 3.375 g (03/07/20 1559)    Active Problems:   Choledocholithiasis   Biliary colic   Choledocholithiasis with acute cholecystitis Abdominal pain Elevated LFT's Leukocytosis   LOS: 0 days   A & P  Choledocholithiasis: GI and General surgery has been consulted. She is receiving IV Zosyn. She has been evaluated by GI. She went for EGD on 03/07/2020. CBD was cleared of sludge. Plan is for the patient to go  for cholecystectomy with general surgery in the morning. Clear liquid diet now. NPO after midnight.   Abdominal pain: Pain control.  Elevated LFT's: Due to choledocholithiasis. Monitor.  Leukocytosis: Due to choledocholithiasis. Monitor.   I have seen and examined this patient myself. I have spent 32 minutes   DVT Prophylaxis: SCD's CODE STATUS: Full Code Family Communication: Spouse at bedside Disposition: The patient is from home. Anticipate discharge to home. Barriers to discharge:  Resolution of choledocholithiasis.  Murriel Holwerda, DO [facility] Direct contact: see [email or web address]  7PM-7AM contact night coverage as above 03/07/2020, 5:05 PM  LOS: 0 days

## 2020-03-07 NOTE — Anesthesia Preprocedure Evaluation (Signed)
Anesthesia Evaluation  Patient identified by MRN, date of birth, ID band Patient awake    Reviewed: Allergy & Precautions, NPO status , Patient's Chart, lab work & pertinent test results  History of Anesthesia Complications Negative for: history of anesthetic complications  Airway Mallampati: I  TM Distance: >3 FB Neck ROM: Full    Dental no notable dental hx. (+) Teeth Intact, Dental Advisory Given   Pulmonary neg pulmonary ROS, neg sleep apnea, neg COPD, Patient abstained from smoking.Not current smoker,  Patient snores at night, tired during the day. Was told by her doctor to consider getting tested for OSA    Pulmonary exam normal breath sounds clear to auscultation       Cardiovascular Exercise Tolerance: Good METS(-) hypertension(-) CAD and (-) Past MI negative cardio ROS  (-) dysrhythmias  Rhythm:Regular Rate:Normal - Systolic murmurs    Neuro/Psych PSYCHIATRIC DISORDERS Anxiety Depression negative neurological ROS  negative psych ROS   GI/Hepatic GERD  Poorly Controlled,(+)     (-) substance abuse  ,   Endo/Other  neg diabetes  Renal/GU negative Renal ROS     Musculoskeletal   Abdominal   Peds  Hematology   Anesthesia Other Findings Past Medical History: No date: Anxiety No date: Depression  Reproductive/Obstetrics                             Anesthesia Physical Anesthesia Plan  ASA: II  Anesthesia Plan: General   Post-op Pain Management:    Induction: Intravenous  PONV Risk Score and Plan: 3 and Ondansetron and Dexamethasone  Airway Management Planned: Oral ETT  Additional Equipment: None  Intra-op Plan:   Post-operative Plan: Extubation in OR  Informed Consent: I have reviewed the patients History and Physical, chart, labs and discussed the procedure including the risks, benefits and alternatives for the proposed anesthesia with the patient or authorized  representative who has indicated his/her understanding and acceptance.     Dental advisory given  Plan Discussed with: CRNA and Surgeon  Anesthesia Plan Comments: (Discussed risks of anesthesia with patient, including PONV, sore throat, lip/dental damage. Rare risks discussed as well, such as cardiorespiratory and neurological sequelae. Patient understands.)        Anesthesia Quick Evaluation

## 2020-03-07 NOTE — Anesthesia Postprocedure Evaluation (Signed)
Anesthesia Post Note  Patient: Jennifer Meyers  Procedure(s) Performed: ENDOSCOPIC RETROGRADE CHOLANGIOPANCREATOGRAPHY (ERCP) (N/A )  Patient location during evaluation: PACU Anesthesia Type: General Level of consciousness: awake and alert Pain management: pain level controlled Vital Signs Assessment: post-procedure vital signs reviewed and stable Respiratory status: spontaneous breathing, nonlabored ventilation, respiratory function stable and patient connected to nasal cannula oxygen Cardiovascular status: blood pressure returned to baseline and stable Postop Assessment: no apparent nausea or vomiting Anesthetic complications: no     Last Vitals:  Vitals:   03/07/20 1127 03/07/20 1142  BP:  103/62  Pulse:  66  Resp:  16  Temp: (!) 36.1 C 36.6 C  SpO2:  98%    Last Pain:  Vitals:   03/07/20 1142  TempSrc: Oral  PainSc:                  Corinda Gubler

## 2020-03-07 NOTE — Op Note (Addendum)
[facility] Gastroenterology Patient Name: Jennifer Meyers Procedure Date: 03/07/2020 9:57 AM MRN: [id number] Account #: [id number] Date of Birth: September 13, 1986 Admit Type: Inpatient Age: 34 Room: [facility] ENDO ROOM 4 Gender: Female Note Status: Finalized Procedure:             ERCP Indications:           Bile duct stone on magnetic resonance                         cholangiopancreatography Providers:             Lucilla Lame MD, MD Referring MD:          Ramonita Lab, MD (Referring MD) Medicines:             General Anesthesia Complications:         No immediate complications. Procedure:             Pre-Anesthesia Assessment:                        - Prior to the procedure, a History and Physical was                         performed, and patient medications and allergies were                         reviewed. The patient's tolerance of previous                         anesthesia was also reviewed. The risks and benefits                         of the procedure and the sedation options and risks                         were discussed with the patient. All questions were                         answered, and informed consent was obtained. Prior                         Anticoagulants: The patient has taken no previous                         anticoagulant or antiplatelet agents. ASA Grade                         Assessment: II - A patient with mild systemic disease.                         After reviewing the risks and benefits, the patient                         was deemed in satisfactory condition to undergo the                         procedure.                        After obtaining informed  consent, the scope was passed                         under direct vision. Throughout the procedure, the                         patient's blood pressure, pulse, and oxygen                         saturations were monitored continuously. The was                         introduced  through the mouth, and used to inject                         contrast into and used to inject contrast into the                         bile duct. The ERCP was accomplished without                         difficulty. The patient tolerated the procedure well. Findings:      The scout film was normal. The esophagus was successfully intubated       under direct vision. The scope was advanced to a normal major papilla in       the descending duodenum without detailed examination of the pharynx,       larynx and associated structures, and upper GI tract. The upper GI tract       was grossly normal. The bile duct was deeply cannulated with the       short-nosed traction sphincterotome. Contrast was injected. I personally       interpreted the bile duct images. There was brisk flow of contrast       through the ducts. Image quality was excellent. Contrast extended to the       entire biliary tree. The middle third of the main bile duct contained       filling defect(s) thought to be sludge. A wire was passed into the       biliary tree. A 7 mm biliary sphincterotomy was made with a traction       (standard) sphincterotome using ERBE electrocautery. There was no       post-sphincterotomy bleeding. The biliary tree was swept with a 15 mm       balloon starting at the bifurcation. Sludge was swept from the duct. Impression:            - The examination was suspicious for sludge.                        - A biliary sphincterotomy was performed.                        - The biliary tree was swept and sludge was found. Recommendation:        - Return patient to hospital ward for ongoing care.                        - Clear liquid diet.                        -  Watch for pancreatitis, bleeding, perforation, and                         cholangitis.                        - Proceed with lap-chole if no post ERCP pancreatitis. Procedure Code(s):     --- Professional ---                        [phone],  Endoscopic retrograde cholangiopancreatography                         (ERCP); with removal of calculi/debris from                         biliary/pancreatic duct(s)                        [id number], Endoscopic retrograde cholangiopancreatography                         (ERCP); with sphincterotomy/papillotomy                        [phone], Endoscopic catheterization of the biliary                         ductal system, radiological supervision and                         interpretation Diagnosis Code(s):     --- Professional ---                        K80.50, Calculus of bile duct without cholangitis or                         cholecystitis without obstruction CPT copyright 2019 American Medical Association. All rights reserved. The codes documented in this report are preliminary and upon coder review may  be revised to meet current compliance requirements. Midge Minium MD, MD 03/07/2020 10:36:41 AM This report has been signed electronically. Number of Addenda: 0 Note Initiated On: 03/07/2020 9:57 AM Estimated Blood Loss:  Estimated blood loss: none.      [facility]

## 2020-03-07 NOTE — Transfer of Care (Signed)
Immediate Anesthesia Transfer of Care Note  Patient: Jennifer Meyers  Procedure(s) Performed: ENDOSCOPIC RETROGRADE CHOLANGIOPANCREATOGRAPHY (ERCP) (N/A )  Patient Location: PACU  Anesthesia Type:General  Level of Consciousness: awake, alert  and oriented  Airway & Oxygen Therapy: Patient Spontanous Breathing and Patient connected to face mask oxygen  Post-op Assessment: Report given to RN and Post -op Vital signs reviewed and stable  Post vital signs: Reviewed and stable  Last Vitals:  Vitals Value Taken Time  BP 115/74 03/07/20 1053  Temp    Pulse 82 03/07/20 1053  Resp    SpO2 100 % 03/07/20 1053    Last Pain:  Vitals:   03/07/20 0834  TempSrc: Oral  PainSc:          Complications: No apparent anesthesia complications

## 2020-03-08 ENCOUNTER — Inpatient Hospital Stay: Payer: BC Managed Care – PPO | Admitting: Anesthesiology

## 2020-03-08 ENCOUNTER — Encounter
Admission: EM | Disposition: A | Discharge status: Home / Self Care | Payer: Self-pay | Source: Home / Self Care | Attending: Internal Medicine

## 2020-03-08 ENCOUNTER — Encounter: Payer: Self-pay | Admitting: Internal Medicine

## 2020-03-08 LAB — COMPREHENSIVE METABOLIC PANEL
ALT: 166 U/L — ABNORMAL HIGH (ref 0–44)
AST: 67 U/L — ABNORMAL HIGH (ref 15–41)
Albumin: 3.2 g/dL — ABNORMAL LOW (ref 3.5–5.0)
Alkaline Phosphatase: 91 U/L (ref 38–126)
Anion gap: 6 (ref 5–15)
BUN: 6 mg/dL (ref 6–20)
CO2: 22 mmol/L (ref 22–32)
Calcium: 8.1 mg/dL — ABNORMAL LOW (ref 8.9–10.3)
Chloride: 110 mmol/L (ref 98–111)
Creatinine, Ser: 0.7 mg/dL (ref 0.44–1.00)
GFR calc Af Amer: >60 mL/min (ref >60)
GFR calc non Af Amer: >60 mL/min (ref >60)
Glucose, Bld: 93 mg/dL (ref 70–99)
Potassium: 3.5 mmol/L (ref 3.5–5.1)
Sodium: 138 mmol/L (ref 135–145)
Total Bilirubin: 1.1 mg/dL (ref 0.3–1.2)
Total Protein: 5.9 g/dL — ABNORMAL LOW (ref 6.5–8.1)

## 2020-03-08 LAB — LIPASE, BLOOD: Lipase: 26 U/L (ref 11–51)

## 2020-03-08 SURGERY — CHOLECYSTECTOMY, ROBOT-ASSISTED, LAPAROSCOPIC
Anesthesia: General | Site: Abdomen

## 2020-03-08 MED ORDER — FENTANYL CITRATE (PF) 100 MCG/2ML IJ SOLN
INTRAMUSCULAR | Status: AC
  Filled 2020-03-08: qty 2

## 2020-03-08 MED ORDER — SODIUM CHLORIDE FLUSH 0.9 % IV SOLN
INTRAVENOUS | Status: AC
  Filled 2020-03-08: qty 10

## 2020-03-08 MED ORDER — MIDAZOLAM HCL 2 MG/2ML IJ SOLN
INTRAMUSCULAR | Status: AC
  Filled 2020-03-08: qty 2

## 2020-03-08 MED ORDER — ROCURONIUM BROMIDE 100 MG/10ML IV SOLN
INTRAVENOUS | Status: DC | PRN
  Administered 2020-03-08: 50 mg via INTRAVENOUS

## 2020-03-08 MED ORDER — SUGAMMADEX SODIUM 200 MG/2ML IV SOLN
INTRAVENOUS | Status: DC | PRN
  Administered 2020-03-08: 200 mg via INTRAVENOUS

## 2020-03-08 MED ORDER — KETOROLAC TROMETHAMINE 30 MG/ML IJ SOLN
INTRAMUSCULAR | Status: AC
  Filled 2020-03-08: qty 1

## 2020-03-08 MED ORDER — BUPIVACAINE-EPINEPHRINE (PF) 0.25% -1:200000 IJ SOLN
INTRAMUSCULAR | Status: AC
  Filled 2020-03-08: qty 30

## 2020-03-08 MED ORDER — ONDANSETRON HCL 4 MG/2ML IJ SOLN
INTRAMUSCULAR | Status: AC
  Filled 2020-03-08: qty 2

## 2020-03-08 MED ORDER — LACTATED RINGERS IV SOLN: INTRAVENOUS | Status: DC | PRN

## 2020-03-08 MED ORDER — OXYCODONE HCL 5 MG PO TABS
ORAL_TABLET | ORAL | Status: AC
  Filled 2020-03-08: qty 1

## 2020-03-08 MED ORDER — INDOCYANINE GREEN 25 MG IV SOLR
1.2500 mg | Freq: Once | INTRAVENOUS | Status: DC
  Filled 2020-03-08 (×2): qty 10

## 2020-03-08 MED ORDER — DEXAMETHASONE SODIUM PHOSPHATE 10 MG/ML IJ SOLN
INTRAMUSCULAR | Status: DC | PRN
  Administered 2020-03-08: 10 mg via INTRAVENOUS

## 2020-03-08 MED ORDER — SODIUM CHLORIDE 0.9 % IV SOLN: INTRAVENOUS | Status: DC

## 2020-03-08 MED ORDER — PROPOFOL 10 MG/ML IV BOLUS
INTRAVENOUS | Status: DC | PRN
  Administered 2020-03-08: 150 mg via INTRAVENOUS

## 2020-03-08 MED ORDER — OXYCODONE HCL 5 MG PO TABS
5.0000 mg | ORAL_TABLET | Freq: Once | ORAL | Status: AC | PRN
  Administered 2020-03-08: 5 mg via ORAL

## 2020-03-08 MED ORDER — FENTANYL CITRATE (PF) 100 MCG/2ML IJ SOLN
INTRAMUSCULAR | Status: AC
  Administered 2020-03-08: 25 ug via INTRAVENOUS
  Filled 2020-03-08: qty 2

## 2020-03-08 MED ORDER — SUCCINYLCHOLINE CHLORIDE 20 MG/ML IJ SOLN
INTRAMUSCULAR | Status: DC | PRN
  Administered 2020-03-08: 180 mg via INTRAVENOUS

## 2020-03-08 MED ORDER — OXYCODONE HCL 5 MG/5ML PO SOLN: 5.0000 mg | Freq: Once | ORAL | Status: AC | PRN

## 2020-03-08 MED ORDER — FENTANYL CITRATE (PF) 100 MCG/2ML IJ SOLN
25.0000 ug | INTRAMUSCULAR | Status: DC | PRN
  Administered 2020-03-08 (×4): 25 ug via INTRAVENOUS

## 2020-03-08 MED ORDER — SEVOFLURANE IN SOLN
RESPIRATORY_TRACT | Status: AC
  Filled 2020-03-08: qty 250

## 2020-03-08 MED ORDER — KETOROLAC TROMETHAMINE 30 MG/ML IJ SOLN
INTRAMUSCULAR | Status: DC | PRN
  Administered 2020-03-08: 30 mg via INTRAVENOUS

## 2020-03-08 MED ORDER — FENTANYL CITRATE (PF) 100 MCG/2ML IJ SOLN
INTRAMUSCULAR | Status: DC | PRN
  Administered 2020-03-08 (×3): 50 ug via INTRAVENOUS

## 2020-03-08 MED ORDER — DEXAMETHASONE SODIUM PHOSPHATE 10 MG/ML IJ SOLN
INTRAMUSCULAR | Status: AC
  Filled 2020-03-08: qty 1

## 2020-03-08 MED ORDER — PROPOFOL 10 MG/ML IV BOLUS
INTRAVENOUS | Status: AC
  Filled 2020-03-08: qty 20

## 2020-03-08 MED ORDER — BUPIVACAINE-EPINEPHRINE 0.25% -1:200000 IJ SOLN
INTRAMUSCULAR | Status: DC | PRN
  Administered 2020-03-08: 25 mL

## 2020-03-08 MED ORDER — ONDANSETRON HCL 4 MG/2ML IJ SOLN
INTRAMUSCULAR | Status: DC | PRN
  Administered 2020-03-08: 4 mg via INTRAVENOUS

## 2020-03-08 MED ORDER — LIDOCAINE HCL (CARDIAC) PF 100 MG/5ML IV SOSY
PREFILLED_SYRINGE | INTRAVENOUS | Status: DC | PRN
  Administered 2020-03-08: 40 mg via INTRAVENOUS

## 2020-03-08 MED ORDER — DEXMEDETOMIDINE HCL IN NACL 400 MCG/100ML IV SOLN
INTRAVENOUS | Status: DC | PRN
Start: 2020-03-08 — End: 2020-03-08
  Administered 2020-03-08: 8 ug via INTRAVENOUS
  Administered 2020-03-08: 4 ug via INTRAVENOUS
  Administered 2020-03-08: 8 ug via INTRAVENOUS

## 2020-03-08 MED ORDER — DEXMEDETOMIDINE HCL IN NACL 80 MCG/20ML IV SOLN
INTRAVENOUS | Status: AC
  Filled 2020-03-08: qty 20

## 2020-03-08 MED ORDER — MIDAZOLAM HCL 2 MG/2ML IJ SOLN
INTRAMUSCULAR | Status: DC | PRN
  Administered 2020-03-08: 2 mg via INTRAVENOUS

## 2020-03-08 SURGICAL SUPPLY — 55 items
BAG INFUSER PRESSURE 100CC (MISCELLANEOUS) IMPLANT
BLADE SURG SZ11 CARB STEEL (BLADE) ×3 IMPLANT
CANISTER SUCT 1200ML W/VALVE (MISCELLANEOUS) ×3 IMPLANT
CANNULA REDUC XI 12-8 STAPL (CANNULA) ×1
CANNULA REDUC XI 12-8MM STAPL (CANNULA) ×1
CANNULA REDUCER 12-8 DVNC XI (CANNULA) ×1 IMPLANT
CHLORAPREP W/TINT 26 (MISCELLANEOUS) ×3 IMPLANT
CLIP VESOLOCK MED LG 6/CT (CLIP) ×3 IMPLANT
COVER LIGHT HANDLE STERIS (MISCELLANEOUS) ×2 IMPLANT
COVER WAND RF STERILE (DRAPES) ×3 IMPLANT
DECANTER SPIKE VIAL GLASS SM (MISCELLANEOUS) ×6 IMPLANT
DEFOGGER SCOPE WARMER CLEARIFY (MISCELLANEOUS) ×3 IMPLANT
DERMABOND ADVANCED (GAUZE/BANDAGES/DRESSINGS) ×2
DERMABOND ADVANCED .7 DNX12 (GAUZE/BANDAGES/DRESSINGS) ×1 IMPLANT
DRAPE ARM DVNC X/XI (DISPOSABLE) ×4 IMPLANT
DRAPE COLUMN DVNC XI (DISPOSABLE) ×1 IMPLANT
DRAPE DA VINCI XI ARM (DISPOSABLE) ×8
DRAPE DA VINCI XI COLUMN (DISPOSABLE) ×2
ELECT REM PT RETURN 9FT ADLT (ELECTROSURGICAL) ×3
ELECTRODE REM PT RTRN 9FT ADLT (ELECTROSURGICAL) ×1 IMPLANT
GLOVE BIO SURGEON STRL SZ 6.5 (GLOVE) ×4 IMPLANT
GLOVE BIO SURGEONS STRL SZ 6.5 (GLOVE) ×2
GLOVE BIOGEL PI IND STRL 6.5 (GLOVE) ×2 IMPLANT
GLOVE BIOGEL PI INDICATOR 6.5 (GLOVE) ×4
GOWN STRL REUS W/ TWL LRG LVL3 (GOWN DISPOSABLE) ×3 IMPLANT
GOWN STRL REUS W/TWL LRG LVL3 (GOWN DISPOSABLE) ×6
GRASPER SUT TROCAR 14GX15 (MISCELLANEOUS) ×2 IMPLANT
IRRIGATOR SUCT 8 DISP DVNC XI (IRRIGATION / IRRIGATOR) IMPLANT
IRRIGATOR SUCTION 8MM XI DISP (IRRIGATION / IRRIGATOR)
IV NS 1000ML (IV SOLUTION)
IV NS 1000ML BAXH (IV SOLUTION) IMPLANT
KIT PINK PAD W/HEAD ARE REST (MISCELLANEOUS) ×3
KIT PINK PAD W/HEAD ARM REST (MISCELLANEOUS) ×1 IMPLANT
LABEL OR SOLS (LABEL) ×3 IMPLANT
NDL INSUFFLATION 14GA 120MM (NEEDLE) ×1 IMPLANT
NEEDLE HYPO 22GX1.5 SAFETY (NEEDLE) ×3 IMPLANT
NEEDLE INSUFFLATION 14GA 120MM (NEEDLE) ×3 IMPLANT
NS IRRIG 500ML POUR BTL (IV SOLUTION) ×3 IMPLANT
OBTURATOR OPTICAL STANDARD 8MM (TROCAR) ×2
OBTURATOR OPTICAL STND 8 DVNC (TROCAR) ×1
OBTURATOR OPTICALSTD 8 DVNC (TROCAR) ×1 IMPLANT
PACK LAP CHOLECYSTECTOMY (MISCELLANEOUS) ×3 IMPLANT
POUCH SPECIMEN RETRIEVAL 10MM (ENDOMECHANICALS) ×3 IMPLANT
SEAL CANN UNIV 5-8 DVNC XI (MISCELLANEOUS) ×3 IMPLANT
SEAL XI 5MM-8MM UNIVERSAL (MISCELLANEOUS) ×6
SET TUBE SMOKE EVAC HIGH FLOW (TUBING) ×3 IMPLANT
SOLUTION ELECTROLUBE (MISCELLANEOUS) ×3 IMPLANT
STAPLER CANNULA SEAL DVNC XI (STAPLE) ×1 IMPLANT
STAPLER CANNULA SEAL XI (STAPLE) ×2
SUT MNCRL 4-0 (SUTURE) ×4
SUT MNCRL 4-0 27XMFL (SUTURE) ×2
SUT VIC AB 3-0 SH 27 (SUTURE)
SUT VIC AB 3-0 SH 27X BRD (SUTURE) IMPLANT
SUT VICRYL 0 AB UR-6 (SUTURE) IMPLANT
SUTURE MNCRL 4-0 27XMF (SUTURE) ×1 IMPLANT

## 2020-03-08 NOTE — Progress Notes (Signed)
Jennifer Bouillon, MD [address], [place], [place], [address] [address], [address], [place], [place], [address] Phone: [phone]  Fax: [phone]   Subjective: No acute events. Pt afebrile.  Patient reports abdominal pain improved after ERCP yesterday   Objective: Exam: Vital signs in last 24 hours: Vitals:   03/07/20 1142 03/07/20 1451 03/07/20 1947 03/08/20 0401  BP: 103/62 101/61 114/77 112/75  Pulse: 66 69 85 77  Resp: 16 14  18   Temp: 97.8 F (36.6 C) 98 F (36.7 C) 98.5 F (36.9 C) 98.3 F (36.8 C)  TempSrc: Oral Oral Oral Oral  SpO2: 98% 100% 98% 97%  Weight:      Height:       Weight change:   Intake/Output Summary (Last 24 hours) at 03/08/2020 1007 Last data filed at 03/08/2020 0335 Gross per 24 hour  Intake 2668.13 ml  Output 1700 ml  Net 968.13 ml    General: No acute distress, AAO x3 Abd: Soft, NT/ND, No HSM Skin: Warm, no rashes Neck: Supple, Trachea midline   Lab Results: Lab Results  Component Value Date   WBC 7.2 03/07/2020   HGB 12.6 03/07/2020   HCT 36.3 03/07/2020   MCV 85.6 03/07/2020   PLT 285 03/07/2020   Micro Results: Recent Results (from the past 240 hour(s))  Respiratory Panel by RT PCR (Flu A&B, Covid) - Nasopharyngeal Swab     Status: None   Collection Time: 03/06/20  4:38 AM   Specimen: Nasopharyngeal Swab  Result Value Ref Range Status   SARS Coronavirus 2 by RT PCR NEGATIVE NEGATIVE Final    Comment: (NOTE) SARS-CoV-2 target nucleic acids are NOT DETECTED. The SARS-CoV-2 RNA is generally detectable in upper respiratoy specimens during the acute phase of infection. The lowest concentration of SARS-CoV-2 viral copies this assay can detect is 131 copies/mL. A negative result does not preclude SARS-Cov-2 infection and should not be used as the sole basis for treatment or other patient management decisions. A negative result may occur with  improper specimen collection/handling, submission of specimen  other than nasopharyngeal swab, presence of viral mutation(s) within the areas targeted by this assay, and inadequate number of viral copies (<131 copies/mL). A negative result must be combined with clinical observations, patient history, and epidemiological information. The expected result is Negative. Fact Sheet for Patients:  05/06/20 Fact Sheet for Healthcare Providers:  https://www.moore.com/ This test is not yet ap proved or cleared by the https://www.young.biz/ FDA and  has been authorized for detection and/or diagnosis of SARS-CoV-2 by FDA under an Emergency Use Authorization (EUA). This EUA will remain  in effect (meaning this test can be used) for the duration of the COVID-19 declaration under Section 564(b)(1) of the Act, 21 U.S.C. section 360bbb-3(b)(1), unless the authorization is terminated or revoked sooner.    Influenza A by PCR NEGATIVE NEGATIVE Final   Influenza B by PCR NEGATIVE NEGATIVE Final    Comment: (NOTE) The Xpert Xpress SARS-CoV-2/FLU/RSV assay is intended as an aid in  the diagnosis of influenza from Nasopharyngeal swab specimens and  should not be used as a sole basis for treatment. Nasal washings and  aspirates are unacceptable for Xpert Xpress SARS-CoV-2/FLU/RSV  testing. Fact Sheet for Patients: [place] Fact Sheet for Healthcare Providers: https://www.moore.com/ This test is not yet approved or cleared by the https://www.young.biz/ FDA and  has been authorized for detection and/or diagnosis of SARS-CoV-2 by  FDA under an Emergency Use Authorization (EUA). This EUA will remain  in effect (meaning this test can be used) for the duration of the  Covid-19 declaration under Section 564(b)(1) of the Act, 21  U.S.C. section 360bbb-3(b)(1), unless the authorization is  terminated or revoked. Performed at [facility], [address] [place], [place]  [address]    Studies/Results: MR 3D Recon At Scanner  Result Date: 03/06/2020 CLINICAL DATA:  Cholecystitis EXAM: MRI ABDOMEN WITHOUT AND WITH CONTRAST (INCLUDING MRCP) TECHNIQUE: Multiplanar multisequence MR imaging of the abdomen was performed both before and after the administration of intravenous contrast. Heavily T2-weighted images of the biliary and pancreatic ducts were obtained, and three-dimensional MRCP images were rendered by post processing. CONTRAST:  7.81mL GADAVIST GADOBUTROL 1 MMOL/ML IV SOLN COMPARISON:  Same-day right upper quadrant ultrasound, 03/06/2020 FINDINGS: Lower chest: Trace pleural effusions. Hepatobiliary: No mass or other parenchymal abnormality identified. Numerous gallstones in the gallbladder. There are multiple tiny gallstones in the distal common bile duct, measuring no greater than 2-3 mm in size (series 9, image 8). Pancreas: No mass, inflammatory changes, or other parenchymal abnormality identified. Spleen:  Within normal limits in size and appearance. Adrenals/Urinary Tract: No masses identified. No evidence of hydronephrosis. Stomach/Bowel: Visualized portions within the abdomen are unremarkable. Vascular/Lymphatic: No pathologically enlarged lymph nodes identified. No abdominal aortic aneurysm demonstrated. Other:  None. Musculoskeletal: No suspicious bone lesions identified. IMPRESSION: 1. Numerous gallstones in the gallbladder. There are multiple tiny gallstones in the distal common bile duct, measuring no greater than 2-3 mm in size. No biliary ductal dilatation. 2. No gallbladder wall thickening or pericholecystic fluid. No inflammatory findings. Electronically Signed   By: Eddie Candle M.D.   On: 03/06/2020 15:08   DG C-Arm 1-60 Min-No Report  Result Date: 03/07/2020 Fluoroscopy was utilized by the requesting physician.  No radiographic interpretation.   MR ABDOMEN MRCP W WO CONTAST  Result Date: 03/06/2020 CLINICAL DATA:  Cholecystitis EXAM: MRI ABDOMEN WITHOUT  AND WITH CONTRAST (INCLUDING MRCP) TECHNIQUE: Multiplanar multisequence MR imaging of the abdomen was performed both before and after the administration of intravenous contrast. Heavily T2-weighted images of the biliary and pancreatic ducts were obtained, and three-dimensional MRCP images were rendered by post processing. CONTRAST:  7.57mL GADAVIST GADOBUTROL 1 MMOL/ML IV SOLN COMPARISON:  Same-day right upper quadrant ultrasound, 03/06/2020 FINDINGS: Lower chest: Trace pleural effusions. Hepatobiliary: No mass or other parenchymal abnormality identified. Numerous gallstones in the gallbladder. There are multiple tiny gallstones in the distal common bile duct, measuring no greater than 2-3 mm in size (series 9, image 8). Pancreas: No mass, inflammatory changes, or other parenchymal abnormality identified. Spleen:  Within normal limits in size and appearance. Adrenals/Urinary Tract: No masses identified. No evidence of hydronephrosis. Stomach/Bowel: Visualized portions within the abdomen are unremarkable. Vascular/Lymphatic: No pathologically enlarged lymph nodes identified. No abdominal aortic aneurysm demonstrated. Other:  None. Musculoskeletal: No suspicious bone lesions identified. IMPRESSION: 1. Numerous gallstones in the gallbladder. There are multiple tiny gallstones in the distal common bile duct, measuring no greater than 2-3 mm in size. No biliary ductal dilatation. 2. No gallbladder wall thickening or pericholecystic fluid. No inflammatory findings. Electronically Signed   By: Eddie Candle M.D.   On: 03/06/2020 15:08   Medications:  Scheduled Meds: Continuous Infusions: . sodium chloride 125 mL/hr at 03/08/20 0335  . sodium chloride Stopped (03/07/20 2225)  . piperacillin-tazobactam (ZOSYN)  IV 3.375 g (03/08/20 0515)   PRN Meds:.sodium chloride, ketorolac, LORazepam, morphine injection, ondansetron **OR** ondansetron (ZOFRAN) IV   Assessment: Active Problems:   Choledocholithiasis   Biliary  colic   Choledocholithiasis with acute cholecystitis    Plan: Cholecystectmy as per surgery No evidence of pancreatitis  Avoid hepatotoxic drugs  Follow up in GI clinic or with PCP to repeat transaminases as an outpt to ensure they have normalized or obtain further workup if still elevated after cholecystectomy The above was discussed with patient and her mother and they verbalized understanding  I will sign off.  Please call if any further GI concerns or questions.  We would like to thank you for the opportunity to participate in the care of Jennifer Meyers.   LOS: 1 day   Jennifer Bouillon, MD 03/08/2020, 10:07 AM

## 2020-03-08 NOTE — Anesthesia Postprocedure Evaluation (Signed)
Anesthesia Post Note  Patient: Jennifer Meyers  Procedure(s) Performed: XI ROBOTIC ASSISTED LAPAROSCOPIC CHOLECYSTECTOMY (N/A Abdomen)  Patient location during evaluation: PACU Anesthesia Type: General Level of consciousness: awake and alert Pain management: pain level controlled Vital Signs Assessment: post-procedure vital signs reviewed and stable Respiratory status: spontaneous breathing, nonlabored ventilation, respiratory function stable and patient connected to nasal cannula oxygen Cardiovascular status: blood pressure returned to baseline and stable Postop Assessment: no apparent nausea or vomiting Anesthetic complications: no     Last Vitals:  Vitals:   03/08/20 1954 03/08/20 2003  BP: 120/83 119/77  Pulse: 89 92  Resp: 16 13  Temp:  36.9 C  SpO2: 99% 99%    Last Pain:  Vitals:   03/08/20 2003  TempSrc:   PainSc: 4                  Cleda Mccreedy Kenlee Vogt

## 2020-03-08 NOTE — Transfer of Care (Signed)
Immediate Anesthesia Transfer of Care Note  Patient: Jennifer Meyers  Procedure(s) Performed: XI ROBOTIC ASSISTED LAPAROSCOPIC CHOLECYSTECTOMY (N/A Abdomen)  Patient Location: PACU  Anesthesia Type:General  Level of Consciousness: awake and alert   Airway & Oxygen Therapy: Patient Spontanous Breathing  Post-op Assessment: Report given to RN and Post -op Vital signs reviewed and stable  Post vital signs: Reviewed and stable  Last Vitals:  Vitals Value Taken Time  BP 111/70 03/08/20 1839  Temp 36.8 C 03/08/20 1839  Pulse 77 03/08/20 1843  Resp 16 03/08/20 1843  SpO2 100 % 03/08/20 1843  Vitals shown include unvalidated device data.  Last Pain:  Vitals:   03/08/20 1839  TempSrc:   PainSc: 0-No pain         Complications: No apparent anesthesia complications

## 2020-03-08 NOTE — Progress Notes (Signed)
PROGRESS NOTE  Jennifer Meyers SWH:[id number] DOB: 02-15-1986 DOA: 03/06/2020 PCP: Jennifer Hector, MD  Brief History   The patient is a 34 yr old woman who presented to [facility] with complaints of epigastric abdominal pain that radiates to her mid-back on 03/05/2020. She denies nausea and vomiting, fever or chills. She was found to have evidence of cholelithiasis and cholecystitis on abdominal ultrasound. LFT's were elevated as was her WBC.   Gastroenterology was consulted. They recommended an MRCP. This was performed and demonstrated numerous gallstones in the gallbladder as well as multiple stones in the CBD which were very small in size. There was no biliary ductal dilatation. There was no gallbladder wall thickening and no peripholecystic fluid and no inflammatory findings.  The patient has been started on IV Zosyn. She has been evaluated by GI. She went for EGD on 03/07/2020. CBD was cleared of sludge. Plan is for the patient to go for cholecystectomy with general surgery later today. She has been NPO after midnight.   Consultants  . Gastroenterology . General Surgery  Procedures  . MRCP  Antibiotics   Anti-infectives (From admission, onward)   Start     Dose/Rate Route Frequency Ordered Stop   03/06/20 1400  [MAR Hold]  piperacillin-tazobactam (ZOSYN) IVPB 3.375 g     (MAR Hold since Fri 03/08/2020 at 1608.Hold Reason: Transfer to a Procedural area.)   3.375 g 12.5 mL/hr over 240 Minutes Intravenous Every 8 hours 03/06/20 0656     03/06/20 0430  ampicillin-sulbactam (UNASYN) 1.5 g in sodium chloride 0.9 % 100 mL IVPB     1.5 g 200 mL/hr over 30 Minutes Intravenous  Once 03/06/20 0418 03/06/20 0508      Subjective  The patient is seen before her procedure. No new complaints.  Objective   Vitals:  Vitals:   03/08/20 1210 03/08/20 1609  BP: 115/69 123/73  Pulse: 68 74  Resp: 15 12  Temp: 98.6 F (37 C)   SpO2: 99% 99%   Exam:  Constitutional:  . The patient is awake,  alert, and oriented x 3. No acute distress. Respiratory:  . No increased work of breathing. . No wheezes, rales, or rhonchi . No tactile fremitus Cardiovascular:  . Regular rate and rhythm . No murmurs, ectopy, or gallups. . No lateral PMI. No thrills. Abdomen:  . Abdomen is soft, slightly distended and tender to palpation. . No hernias, masses, or organomegaly . Normoactive bowel sounds.  Musculoskeletal:  . No cyanosis, clubbing, or edema Skin:  . No rashes, lesions, ulcers . palpation of skin: no induration or nodules Neurologic:  . CN 2-12 intact . Sensation all 4 extremities intact Psychiatric:  . Mental status o Mood, affect appropriate o Orientation to person, place, time  . judgment and insight appear intact  I have personally reviewed the following:   Today's Data  . Vitals, CMP, CBC  Imaging  . MRCP  Procedures: ERCP 03/07/2020  Scheduled Meds: . indocyanine green  1.25 mg Intravenous Once  . sodium chloride flush       Continuous Infusions: . sodium chloride 125 mL/hr at 03/08/20 1012  . [MAR Hold] sodium chloride Stopped (03/07/20 2225)  . [MAR Hold] piperacillin-tazobactam (ZOSYN)  IV 3.375 g (03/08/20 1446)    Active Problems:   Choledocholithiasis   Biliary colic   Choledocholithiasis with acute cholecystitis Abdominal pain Elevated LFT's Leukocytosis   LOS: 1 day   A & P  Choledocholithiasis: GI and General surgery has been consulted.  She is receiving IV Zosyn. She has been evaluated by GI. She went for EGD on 03/07/2020. CBD was cleared of sludge. Plan is for the patient to go for cholecystectomy later today. She has been NPO after midnight.  Abdominal pain: Pain control.  Elevated LFT's: Due to choledocholithiasis. Monitor.  Leukocytosis: Due to choledocholithiasis. Monitor.   I have seen and examined this patient myself. I have spent 30 minutes   DVT Prophylaxis: SCD's CODE STATUS: Full Code Family Communication: Spouse at  bedside Disposition: The patient is from home. Anticipate discharge to home. Barriers to discharge: Resolution of choledocholithiasis via ERCP and cholecystectomy.  [name], DO [facility] Direct contact: see [email or web address]  7PM-7AM contact night coverage as above 03/08/2020, 5:48 PM  LOS: 0 days

## 2020-03-08 NOTE — Progress Notes (Signed)
Pharmacy Antibiotic Note  Jennifer Meyers is a 34 y.o. female admitted on 03/06/2020 with cholecystitis and choledocolithiasis.  Pharmacy has been consulted for zosyn dosing.  Today is Day 3 of antibiotics. Per Surgery, may require 10-14 days but will continue to follow.  Plan: Zosyn 3.375g IV q8h (4 hour infusion).  Height: 5\' 11"  (180.3 cm) Weight: 90.7 kg (200 lb) IBW/kg (Calculated) : 70.8  Temp (24hrs), Avg:97.9 F (36.6 C), Min:97 F (36.1 C), Max:98.5 F (36.9 C)  Recent Labs  Lab 03/06/20 0018 03/07/20 0825 03/08/20 0446  WBC 13.8* 7.2  --   CREATININE 0.66 0.75 0.70    Estimated Creatinine Clearance: 126.8 mL/min (by C-G formula based on SCr of 0.7 mg/dL).    Allergies  Allergen Reactions  . Sulfa Antibiotics Anaphylaxis   Thank you for allowing pharmacy to be a part of this patient's care.  05/08/20, PharmD Clinical Pharmacist 03/08/2020 11:19 AM

## 2020-03-08 NOTE — Anesthesia Preprocedure Evaluation (Addendum)
Anesthesia Evaluation  Patient identified by MRN, date of birth, ID band Patient awake    Reviewed: Allergy & Precautions, H&P , NPO status , Patient's Chart, lab work & pertinent test results  History of Anesthesia Complications Negative for: history of anesthetic complications  Airway Mallampati: II  TM Distance: >3 FB Neck ROM: full    Dental  (+) Chipped   Pulmonary neg pulmonary ROS, neg shortness of breath,    Pulmonary exam normal        Cardiovascular Exercise Tolerance: Good (-) angina(-) Past MI and (-) DOE negative cardio ROS Normal cardiovascular exam     Neuro/Psych PSYCHIATRIC DISORDERS negative neurological ROS     GI/Hepatic negative GI ROS, Neg liver ROS, neg GERD  ,  Endo/Other  negative endocrine ROS  Renal/GU      Musculoskeletal   Abdominal   Peds  Hematology negative hematology ROS (+)   Anesthesia Other Findings Patient reports baseline sore throat and hoarseness from her procedure yesterday  Past Medical History: No date: Anxiety No date: Depression  Past Surgical History: No date: DILATION AND CURETTAGE OF UTERUS 03/07/2020: ERCP; N/A     Comment:  Procedure: ENDOSCOPIC RETROGRADE               CHOLANGIOPANCREATOGRAPHY (ERCP);  Surgeon: Midge Minium,               MD;  Location: [facility] ENDOSCOPY;  Service: Endoscopy;                Laterality: N/A;  BMI    Body Mass Index: 27.89 kg/m      Reproductive/Obstetrics negative OB ROS                            Anesthesia Physical Anesthesia Plan  ASA: II  Anesthesia Plan: General ETT   Post-op Pain Management:    Induction: Intravenous  PONV Risk Score and Plan: Ondansetron, Dexamethasone, Midazolam and Treatment may vary due to age or medical condition  Airway Management Planned: Oral ETT  Additional Equipment:   Intra-op Plan:   Post-operative Plan: Extubation in OR  Informed Consent: I have  reviewed the patients History and Physical, chart, labs and discussed the procedure including the risks, benefits and alternatives for the proposed anesthesia with the patient or authorized representative who has indicated his/her understanding and acceptance.     Dental Advisory Given  Plan Discussed with: Anesthesiologist, CRNA and Surgeon  Anesthesia Plan Comments: (Patient consented for risks of anesthesia including but not limited to:  - adverse reactions to medications - damage to eyes, teeth, lips or other oral mucosa - nerve damage due to positioning  - sore throat or hoarseness - Damage to heart, brain, nerves, lungs or loss of life  Patient voiced understanding.)        Anesthesia Quick Evaluation

## 2020-03-08 NOTE — Interval H&P Note (Signed)
History and Physical Interval Note:  03/08/2020 4:35 PM  Jennifer Meyers  has presented today for surgery, with the diagnosis of cholecystitis.  The various methods of treatment have been discussed with the patient and family. After consideration of risks, benefits and other options for treatment, the patient has consented to  Procedure(s): XI ROBOTIC ASSISTED LAPAROSCOPIC CHOLECYSTECTOMY (N/A) as a surgical intervention.  The patient's history has been reviewed, patient examined, no change in status, stable for surgery.  I have reviewed the patient's chart and labs.  Questions were answered to the patient's satisfaction.     Carolan Shiver

## 2020-03-08 NOTE — Anesthesia Procedure Notes (Signed)
Procedure Name: Intubation Date/Time: 03/08/2020 5:10 PM Performed by: Jerrye Noble, CRNA Pre-anesthesia Checklist: Patient identified, Emergency Drugs available, Suction available and Patient being monitored Patient Re-evaluated:Patient Re-evaluated prior to induction Oxygen Delivery Method: Circle system utilized Preoxygenation: Pre-oxygenation with 100% oxygen Induction Type: IV induction Ventilation: Mask ventilation without difficulty Laryngoscope Size: Mac and 3 Grade View: Grade I Tube type: Oral Tube size: 7.0 mm Number of attempts: 1 Airway Equipment and Method: Stylet and Oral airway Placement Confirmation: ETT inserted through vocal cords under direct vision,  positive ETCO2 and breath sounds checked- equal and bilateral Secured at: 22 cm Tube secured with: Tape Dental Injury: Teeth and Oropharynx as per pre-operative assessment

## 2020-03-08 NOTE — Op Note (Signed)
Preoperative diagnosis: History of choledocholithiasis                                             Cholelithiasis  Postoperative diagnosis: History of choledocholithiasis                                              Cholelithiasis  Procedure: Robotic Assisted Laparoscopic Cholecystectomy.   Anesthesia: GETA   Surgeon: Dr. Windell Moment  Wound Classification: Clean Contaminated  Indications: Patient is a 34 y.o. female developed right upper quadrant pain/nausea/vomiting/fever/leukocytosis and on workup was found to have choledocholithiasis. Robotic Assisted Laparoscopic cholecystectomy was elected.  Findings: Mild inflammation of the gallbladder.  Critical view of safety achieved Cystic duct and artery identified, ligated and divided Adequate hemostasis  Description of procedure: The patient was placed on the operating table in the supine position. General anesthesia was induced. A time-out was completed verifying correct patient, procedure, site, positioning, and implant(s) and/or special equipment prior to beginning this procedure. An orogastric tube was placed. The abdomen was prepped and draped in the usual sterile fashion.  An incision was made in a natural skin line below the umbilicus.  The fascia was elevated and the Veress needle inserted. Proper position was confirmed by aspiration and saline meniscus test.  The abdomen was insufflated with carbon dioxide to a pressure of 15 mmHg. The patient tolerated insufflation well. A 8-mm trocar was then inserted in optiview fashion.  The laparoscope was inserted and the abdomen inspected. No injuries from initial trocar placement were noted. Additional trocars were then inserted in the following locations: an 8-mm trocar in the left lateral abdomen, and another two 8-mm trocars to the right side of the abdomen 5 cm appart. The umbilical trocar was changed to a 12 mm trocar all under direct visualization. The abdomen was inspected and no  abnormalities were found. The table was placed in the reverse Trendelenburg position with the right side up. The robotic arms were docked and target anatomy identified. Instrument inserted under direct visualization.  Filmy adhesions between the gallbladder and omentum, duodenum and transverse colon were lysed with electrocautery. The dome of the gallbladder was grasped with a prograsp and retracted over the dome of the liver. The infundibulum was also grasped with an atraumatic grasper and retracted toward the right lower quadrant. This maneuver exposed Calot's triangle. The peritoneum overlying the gallbladder infundibulum was then incised and the cystic duct and cystic artery identified and circumferentially dissected. Critical view of safety reviewed before ligating any structure. Firefly images taken to visualize biliary ducts. The cystic duct and cystic artery were then doubly clipped and divided close to the gallbladder.  The gallbladder was then dissected from its peritoneal attachments by electrocautery. Hemostasis was checked and the gallbladder and contained stones were removed using an endoscopic retrieval bag. The gallbladder was passed off the table as a specimen. The gallbladder fossa was copiously irrigated with saline and hemostasis was obtained. There was no evidence of bleeding from the gallbladder fossa or cystic artery or leakage of the bile from the cystic duct stump. Secondary trocars were removed under direct vision. No bleeding was noted. The robotic arms were undoked. The scope was withdrawn and the umbilical trocar removed. The abdomen was allowed  to collapse. The fascia of the 91mm trocar sites was closed with figure-of-eight 0 vicryl sutures. The skin was closed with subcuticular sutures of 4-0 monocryl and topical skin adhesive. The orogastric tube was removed.  The patient tolerated the procedure well and was taken to the postanesthesia care unit in stable condition.   Specimen:  Gallbladder  Complications: None  EBL: 5 mL

## 2020-03-09 LAB — BASIC METABOLIC PANEL
Anion gap: 8 (ref 5–15)
BUN: 6 mg/dL (ref 6–20)
CO2: 24 mmol/L (ref 22–32)
Calcium: 8.9 mg/dL (ref 8.9–10.3)
Chloride: 106 mmol/L (ref 98–111)
Creatinine, Ser: 0.74 mg/dL (ref 0.44–1.00)
GFR calc Af Amer: >60 mL/min (ref >60)
GFR calc non Af Amer: >60 mL/min (ref >60)
Glucose, Bld: 145 mg/dL — ABNORMAL HIGH (ref 70–99)
Potassium: 3.7 mmol/L (ref 3.5–5.1)
Sodium: 138 mmol/L (ref 135–145)

## 2020-03-09 LAB — LIPASE, BLOOD: Lipase: 21 U/L (ref 11–51)

## 2020-03-09 MED ORDER — HYDROCODONE-ACETAMINOPHEN 5-325 MG PO TABS: 1.0000 | ORAL_TABLET | ORAL | 0 refills | Status: AC | PRN

## 2020-03-09 NOTE — Progress Notes (Signed)
Jennifer Meyers  A and O x 4. VSS. Pt tolerating diet well. No complaints of pain or nausea. IV removed intact, prescriptions given. Pt voiced understanding of discharge instructions with no further questions. Pt discharged via wheelchair.     Allergies as of 03/09/2020      Reactions   Sulfa Antibiotics Anaphylaxis      Medication List    TAKE these medications   citalopram 20 MG tablet Commonly known as: CELEXA Take 20 mg by mouth daily.   HYDROcodone-acetaminophen 5-325 MG tablet Commonly known as: Norco Take 1 tablet by mouth every 4 (four) hours as needed for up to 3 days for moderate pain.   ibuprofen 800 MG tablet Commonly known as: ADVIL Take 800 mg by mouth 3 (three) times daily as needed for pain.   montelukast 10 MG tablet Commonly known as: SINGULAIR Take 10 mg by mouth daily.   Vitafol-OB+DHA 65-1 & 250 MG Misc Take 1 packet by mouth daily.       Vitals:   03/08/20 2123 03/09/20 0421  BP: 116/77 118/79  Pulse: 84 74  Resp: 17 18  Temp: 98.7 F (37.1 C) 97.7 F (36.5 C)  SpO2: 97% 97%    Suzzanne Cloud

## 2020-03-09 NOTE — Discharge Instructions (Signed)
  Diet: Resume home heart healthy regular diet.   Activity: Increase activity as tolerated. Light activity and walking are encouraged. Do not drive or drink alcohol if taking narcotic pain medications.  Wound care: May shower with soapy water and pat dry (do not rub incisions), but no baths or submerging incision underwater until follow-up. (no swimming)   Medications: Resume all home medications. For mild to moderate pain: acetaminophen (Tylenol) or ibuprofen (if no kidney disease). Combining Tylenol with alcohol can substantially increase your risk of causing liver disease. Narcotic pain medications, if prescribed, can be used for severe pain, though may cause nausea, constipation, and drowsiness. Do not combine Tylenol and Norco within a 6 hour period as Norco contains Tylenol. If you do not need the narcotic pain medication, you do not need to fill the prescription.  Call office ([phone]) at any time if any questions, worsening pain, fevers/chills, bleeding, drainage from incision site, or other concerns.  

## 2020-03-09 NOTE — Progress Notes (Signed)
PROGRESS NOTE  Jennifer Meyers BBC:[id number] DOB: 1986/05/17 DOA: 03/06/2020 PCP: Lynnea Ferrier, MD  Brief History   The patient is a 34 yr old woman who presented to [facility] with complaints of epigastric abdominal pain that radiates to her mid-back on 03/05/2020. She denies nausea and vomiting, fever or chills. She was found to have evidence of cholelithiasis and cholecystitis on abdominal ultrasound. LFT's were elevated as was her WBC.   Gastroenterology was consulted. They recommended an MRCP. This was performed and demonstrated numerous gallstones in the gallbladder as well as multiple stones in the CBD which were very small in size. There was no biliary ductal dilatation. There was no gallbladder wall thickening and no peripholecystic fluid and no inflammatory findings.  The patient has been started on IV Zosyn. She has been evaluated by GI. She went for EGD on 03/07/2020. CBD was cleared of sludge. The patient went for cholecystectomy with general surgery late on 03/08/2020. She has tolerated the procedure well.  Consultants  . Gastroenterology . General Surgery  Procedures  . MRCP  Antibiotics   Anti-infectives (From admission, onward)   Start     Dose/Rate Route Frequency Ordered Stop   03/06/20 1400  piperacillin-tazobactam (ZOSYN) IVPB 3.375 g     3.375 g 12.5 mL/hr over 240 Minutes Intravenous Every 8 hours 03/06/20 0656     03/06/20 0430  ampicillin-sulbactam (UNASYN) 1.5 g in sodium chloride 0.9 % 100 mL IVPB     1.5 g 200 mL/hr over 30 Minutes Intravenous  Once 03/06/20 0418 03/06/20 0508      Subjective  The patient is standing in her room preparing for discharge. She has no new complaints.   Objective   Vitals:  Vitals:   03/08/20 2123 03/09/20 0421  BP: 116/77 118/79  Pulse: 84 74  Resp: 17 18  Temp: 98.7 F (37.1 C) 97.7 F (36.5 C)  SpO2: 97% 97%   Exam:  Constitutional:  . The patient is awake, alert, and oriented x 3. No acute  distress. Respiratory:  . No increased work of breathing. . No wheezes, rales, or rhonchi . No tactile fremitus Cardiovascular:  . Regular rate and rhythm . No murmurs, ectopy, or gallups. . No lateral PMI. No thrills. Abdomen:  . Abdomen is soft, slightly distended and tender to palpation. . No hernias, masses, or organomegaly . Normoactive bowel sounds.  Musculoskeletal:  . No cyanosis, clubbing, or edema Skin:  . No rashes, lesions, ulcers . palpation of skin: no induration or nodules Neurologic:  . CN 2-12 intact . Sensation all 4 extremities intact Psychiatric:  . Mental status o Mood, affect appropriate o Orientation to person, place, time  . judgment and insight appear intact  I have personally reviewed the following:   Today's Data  . Vitals, BMP  Imaging  . MRCP  Procedures: ERCP 03/07/2020  Scheduled Meds:  Continuous Infusions: . sodium chloride 10 mL/hr at 03/09/20 0611  . sodium chloride 50 mL/hr at 03/09/20 0611  . piperacillin-tazobactam (ZOSYN)  IV 3.375 g (03/09/20 5038)    Active Problems:   Choledocholithiasis   Biliary colic   Choledocholithiasis with acute cholecystitis Abdominal pain Elevated LFT's Leukocytosis   LOS: 2 days   A & P  Choledocholithiasis: GI and General surgery has been consulted. She is receiving IV Zosyn. She has been evaluated by GI. She went for EGD on 03/07/2020. CBD was cleared of sludge. The patient went for cholecystectomy with general surgery late on 03/08/2020. She  has tolerated the procedure well.  Abdominal pain: Resolved.   Elevated LFT's: Due to choledocholithiasis. Monitor.  Leukocytosis: Due to choledocholithiasis. Monitor.   I have seen and examined this patient myself. I have spent 32 minutes   DVT Prophylaxis: SCD's CODE STATUS: Full Code Family Communication: Spouse at bedside Disposition: The patient is from home. Anticipate discharge to home. She is medically cleared for discharge. Tiara Maultsby,  DO [facility] Direct contact: see [email or web address]  7PM-7AM contact night coverage as above 03/09/2020, 11:45 AM  LOS: 0 days

## 2020-03-09 NOTE — Discharge Summary (Signed)
  Patient ID: Jennifer Meyers MRN: [id number] DOB/AGE: May 21, 1986 34 y.o.  Admit date: 03/06/2020 Discharge date: 03/09/2020   Discharge Diagnoses:  Active Problems:   Choledocholithiasis   Biliary colic   Choledocholithiasis with acute cholecystitis   Procedures: Robotic assisted laparoscopic cholecystectomy                        ERCP  Hospital Course: Patient admitted with choledocholithiasis.  She underwent ERCP.  She tolerated the procedure well.  The next day she had robotic assisted laparoscopic cholecystectomy.  She tolerated this procedure as well.  The pain is controlled.  Patient is tolerating diet.  She is ambulating.  Wounds are healing well.  Physical Exam  Constitutional: She is oriented to person, place, and time and well-developed, well-nourished, and in no distress.  Cardiovascular: Normal rate.  Pulmonary/Chest: Effort normal.  Abdominal: Soft. She exhibits no distension. There is no abdominal tenderness. There is no rebound.  Neurological: She is alert and oriented to person, place, and time.     Consults: Gastroenterology                   General surgery  Disposition: Discharge disposition: 01-Home or Self Care       Discharge Instructions    Diet - low sodium heart healthy   Complete by: As directed    Increase activity slowly   Complete by: As directed      Allergies as of 03/09/2020      Reactions   Sulfa Antibiotics Anaphylaxis      Medication List    TAKE these medications   citalopram 20 MG tablet Commonly known as: CELEXA Take 20 mg by mouth daily.   HYDROcodone-acetaminophen 5-325 MG tablet Commonly known as: Norco Take 1 tablet by mouth every 4 (four) hours as needed for up to 3 days for moderate pain.   ibuprofen 800 MG tablet Commonly known as: ADVIL Take 800 mg by mouth 3 (three) times daily as needed for pain.   montelukast 10 MG tablet Commonly known as: SINGULAIR Take 10 mg by mouth daily.   Vitafol-OB+DHA 65-1 &  250 MG Misc Take 1 packet by mouth daily.      Follow-up Information    Carolan Shiver, MD Follow up in 2 week(s).   Specialty: General Surgery Why: for cholecystectomy follow up.  Contact information: [address] [place] [address] [phone]

## 2020-03-12 LAB — SURGICAL PATHOLOGY

## 2022-04-07 IMAGING — US US ABDOMEN LIMITED
1 series · 14 of 25 positions shown · non-contrast
Comparison: None.

CLINICAL DATA: Right upper quadrant pain

EXAM:
ULTRASOUND ABDOMEN LIMITED RIGHT UPPER QUADRANT

[Series 1: us abdomen limited ruq · 14 of 44 slices shown]
[im 1/44]
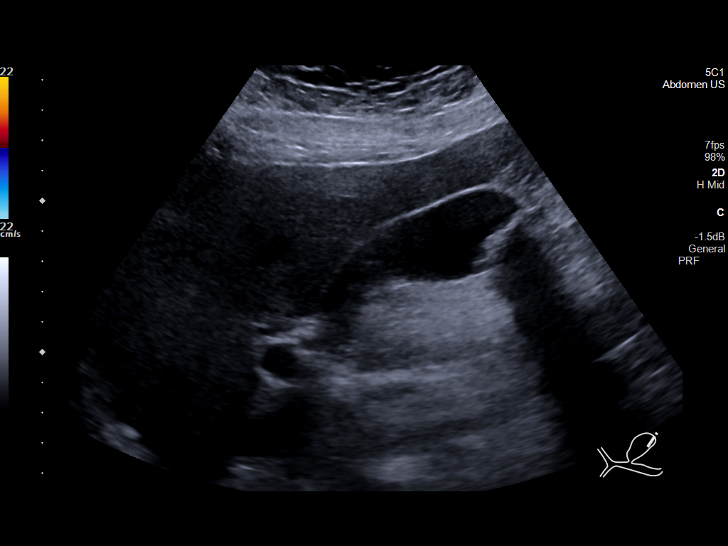
[im 4/44]
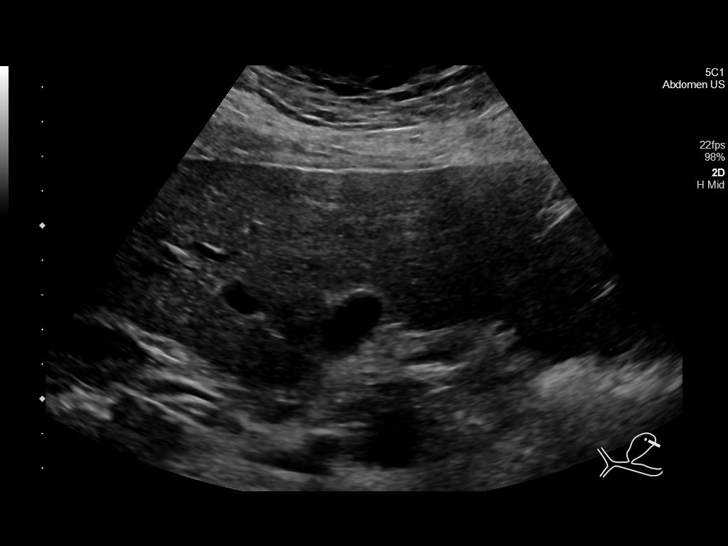
[im 8/44]
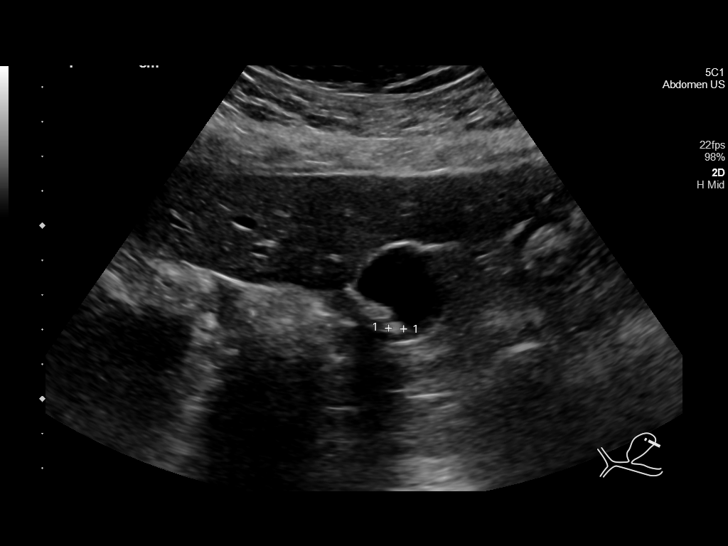
[im 11/44]
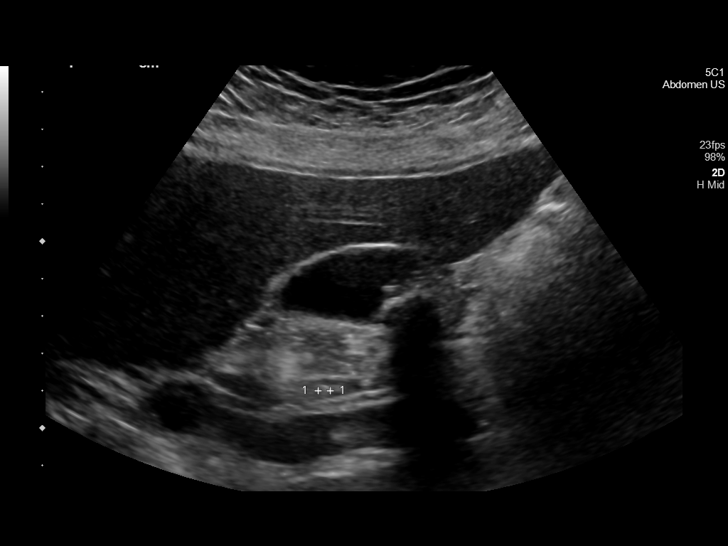
[im 15/44]
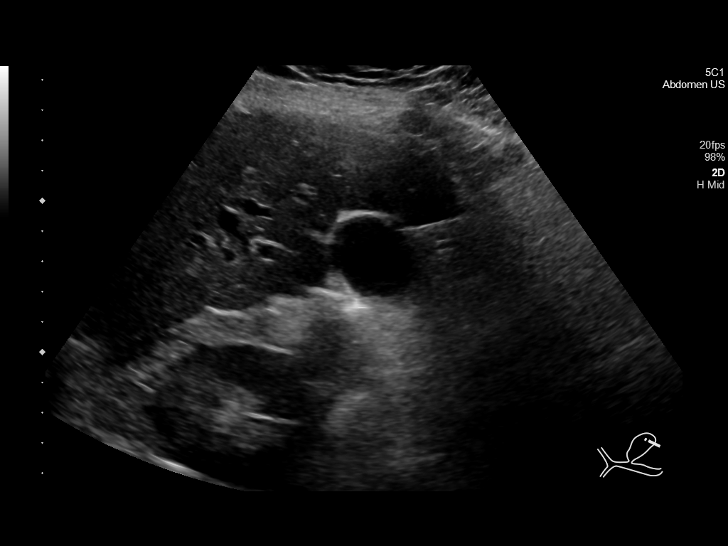
[im 17/44]
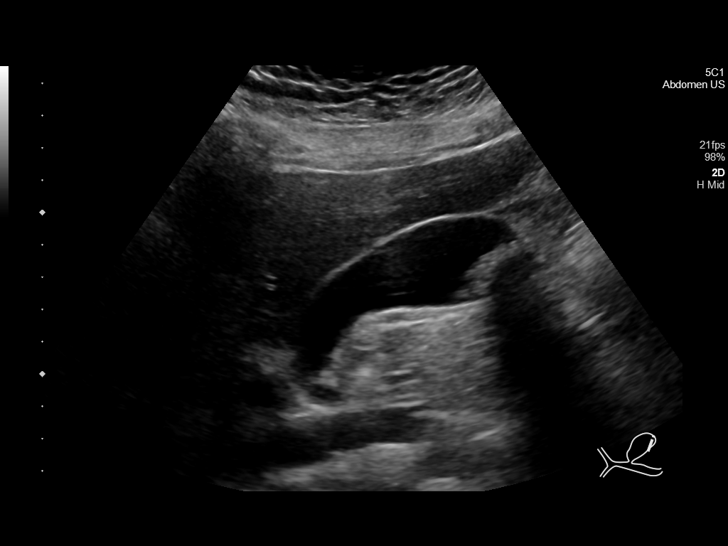
[im 20/44]
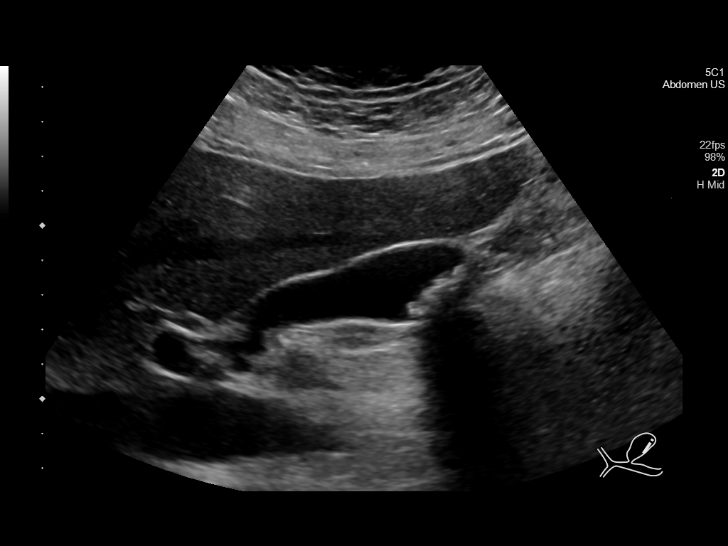
[im 24/44]
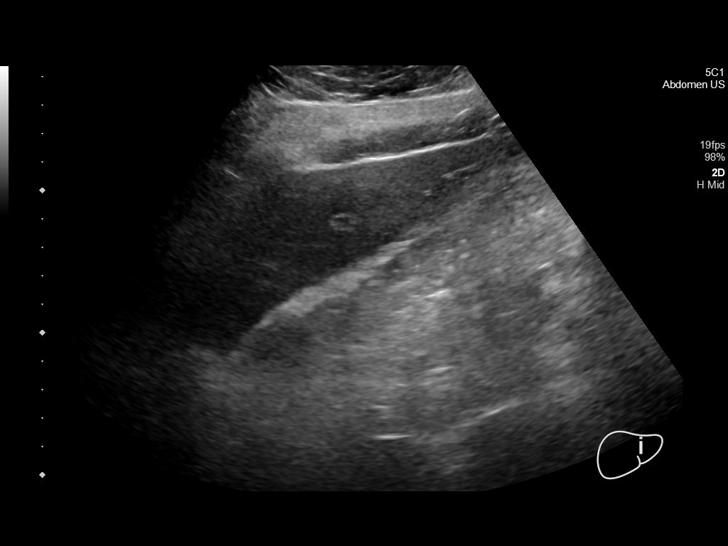
[im 27/44]
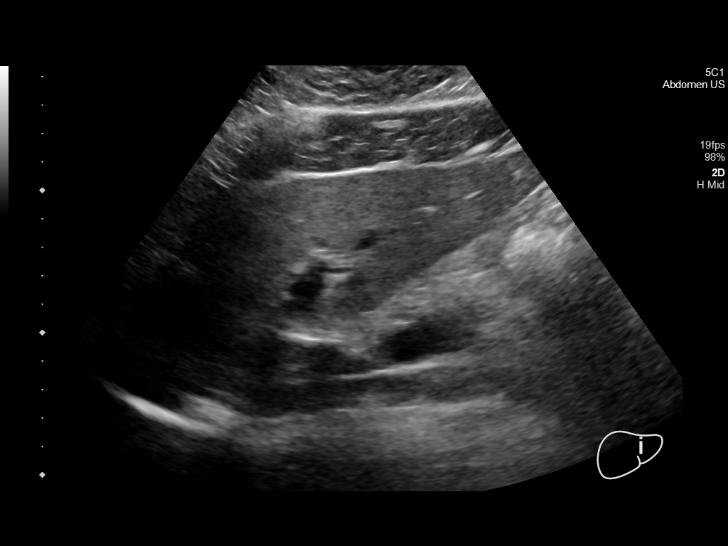
[im 29/44]
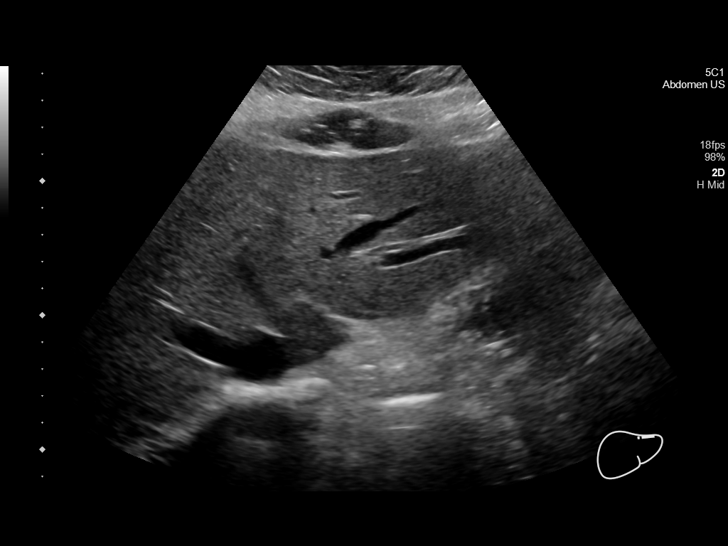
[im 33/44]
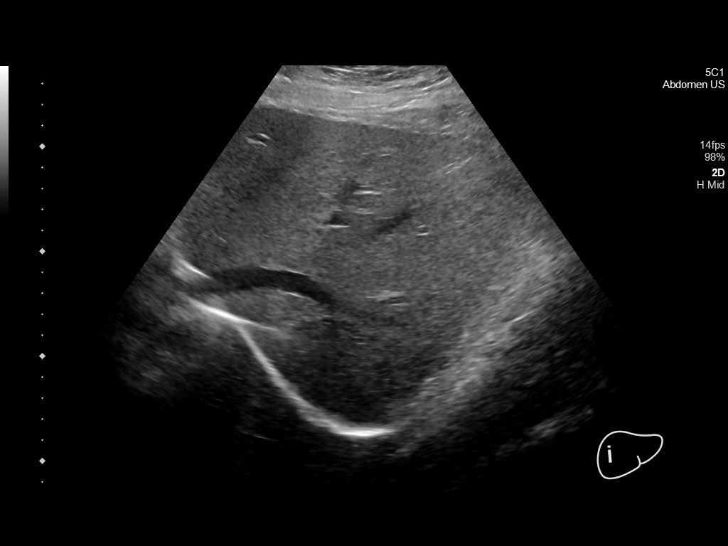
[im 36/44]
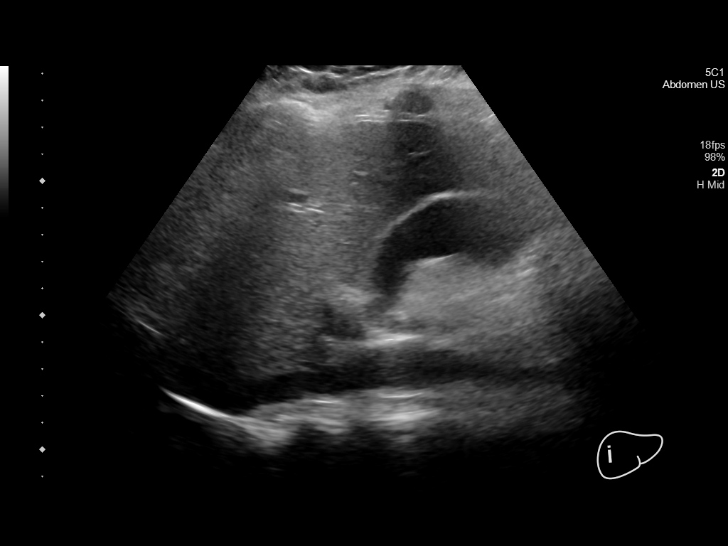
[im 40/44]
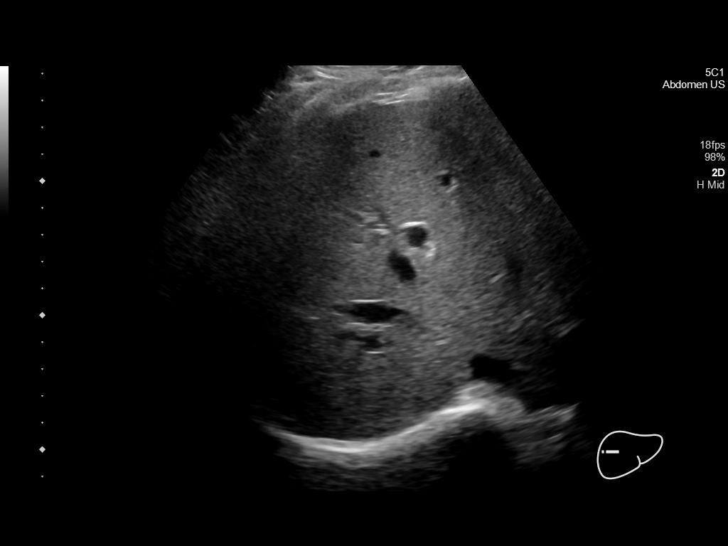
[im 44/44]
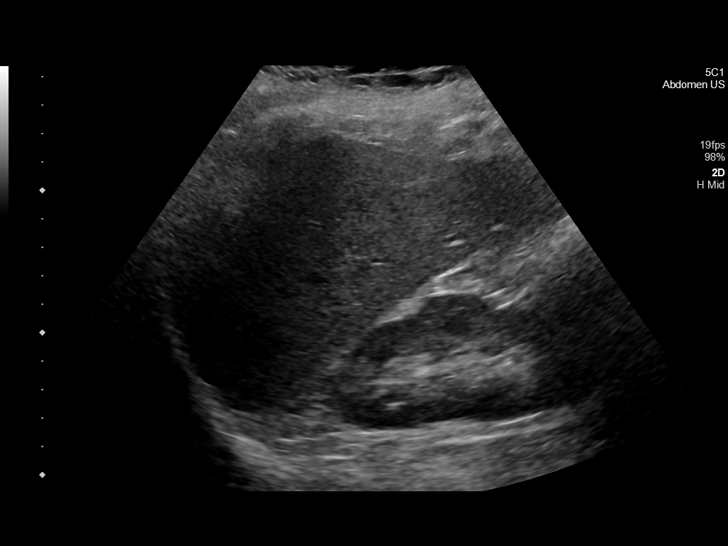

[14 of 25 positions shown; findings below may reference images not displayed]

FINDINGS: Gallbladder:

Layering and shadowing gallstones. No wall thickening or focal
tenderness.

Common bile duct:

Diameter: 5 mm. Although normal diameter there is visible echogenic
foci consistent with calculi measuring up to 3 mm.

Liver:

No focal lesion identified. Within normal limits in parenchymal
echogenicity. Portal vein is patent on color Doppler imaging with
normal direction of blood flow towards the liver.
IMPRESSION: 1. Cholelithiasis and choledocholithiasis.
2. No evidence of acute cholecystitis.

## 2022-04-07 IMAGING — MR MR ABDOMEN WO/W CM MRCP
18 of 19 series · 45 of 48 positions shown · IV contrast (gadavist)
Comparison: Same-day right upper quadrant ultrasound, 03/06/2020

CLINICAL DATA: Cholecystitis

EXAM:
MRI ABDOMEN WITHOUT AND WITH CONTRAST (INCLUDING MRCP)
TECHNIQUE: Multiplanar multisequence MR imaging of the abdomen was performed
both before and after the administration of intravenous contrast.
Heavily T2-weighted images of the biliary and pancreatic ducts were
obtained, and three-dimensional MRCP images were rendered by post
processing.
CONTRAST:  7.5mL GADAVIST GADOBUTROL 1 MMOL/ML IV SOLN

[Series 3: T2 · coronal · 6.0mm · 1.19mm/px · 2 of 30 slices shown (1 of 2)]
[im 1/30]
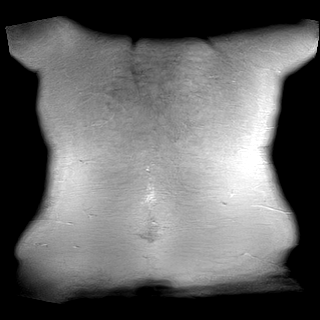
[im 30/30]
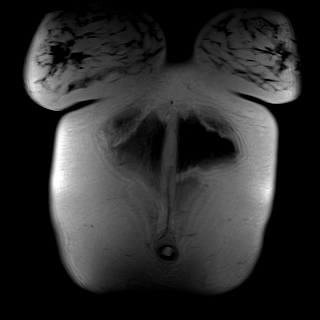

[Series 4: T2 · axial · 6.0mm · 1.19mm/px · z∈[-84,+161]mm · 2 of 35 slices shown (2 of 2)]
[im 1/35]
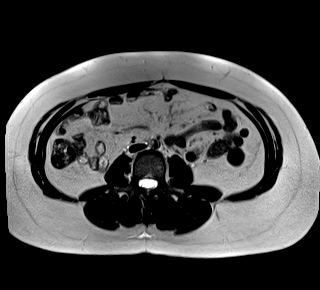
[im 35/35]
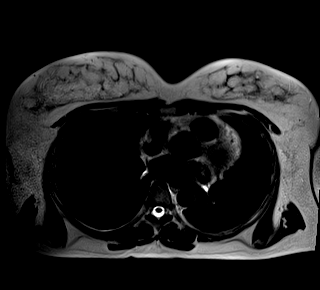

[Series 5: T1 · axial · 6.0mm · 0.74mm/px · z∈[-84,+161]mm · 3 of 70 slices shown]
[im 1/70]
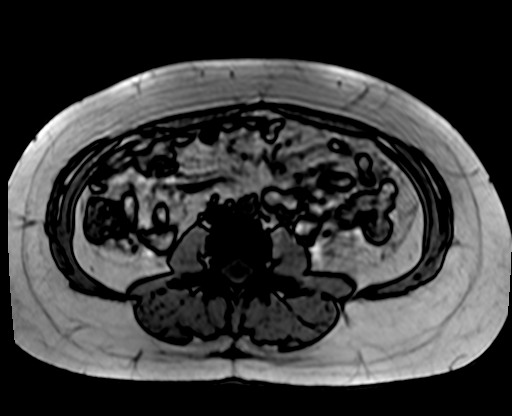
[im 35/70]
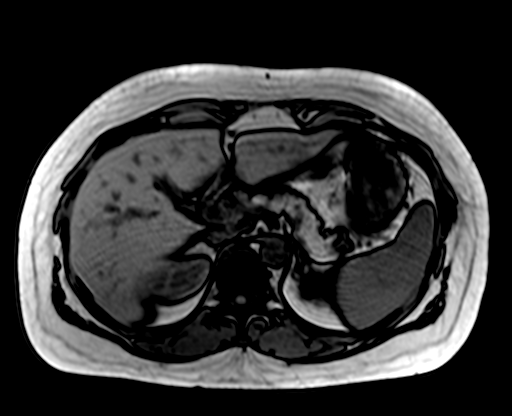
[im 70/70]
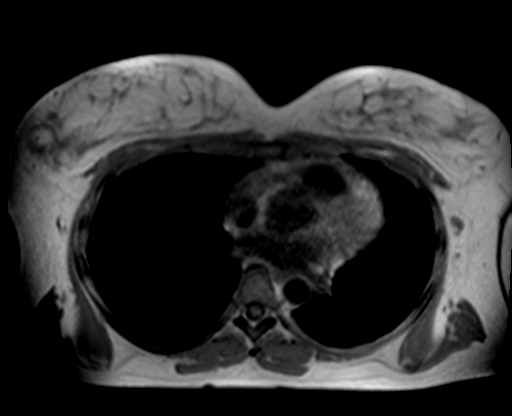

[Series 8: T2 fat-sat · axial · 6.0mm · 1.19mm/px · 1 of 35 slices shown]
[im 1/35]
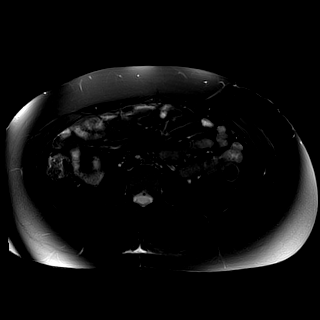

[Series 9: MRCP · coronal · 3.0mm · 1.12mm/px · 1 of 17 slices shown]
[im 1/17]
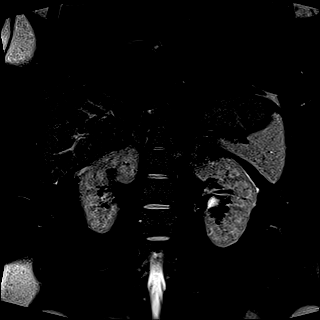

[Series 10: ax dwi_tracew · axial · 6.0mm · 1.42mm/px · z∈[-91,+161]mm · 4 of 108 slices shown]
[im 1/108]
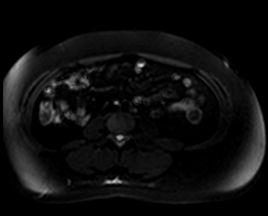
[im 36/108]
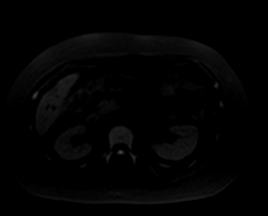
[im 72/108]
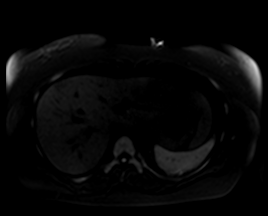
[im 108/108]
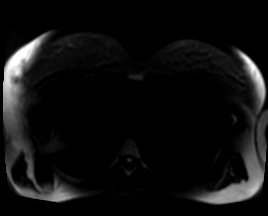

[Series 11: ax dwi_adc · axial · 6.0mm · 1.42mm/px · 1 of 36 slices shown]
[im 1/36]
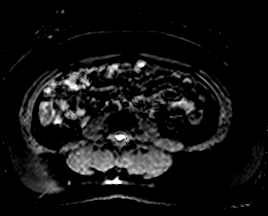

[Series 15: radials · coronal · 50.0mm · 0.78mm/px · 1 of 5 slices shown]
[im 1/5]
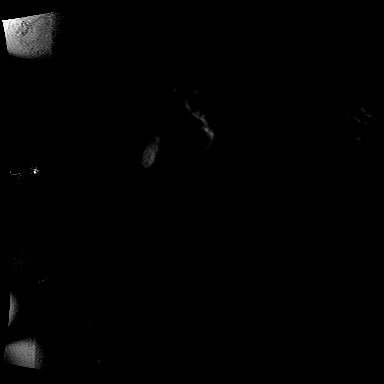

[Series 16: T1 dynamic fat-sat · axial · non-contrast · 3.0mm · 1.19mm/px · z∈[-68,+169]mm · 3 of 80 slices shown (1 of 5)]
[im 1/80]
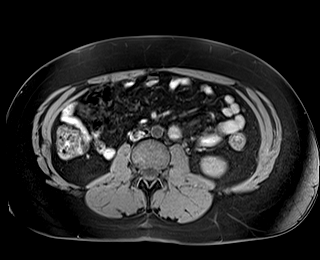
[im 40/80]
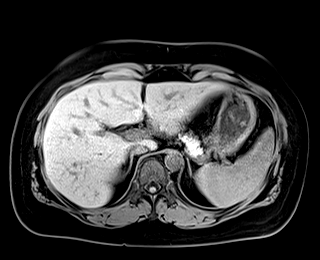
[im 80/80]
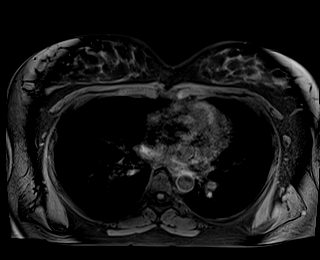

[Series 17: T1 dynamic fat-sat post-contrast · axial · 3.0mm · 1.19mm/px · z∈[-68,+169]mm · 3 of 80 slices shown (1 of 4)]
[im 1/80]
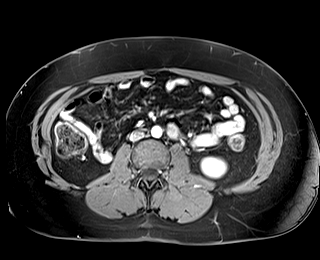
[im 40/80]
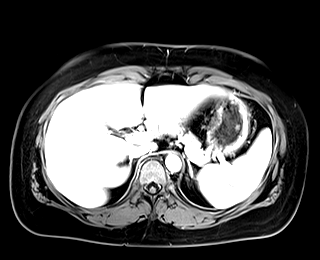
[im 80/80]
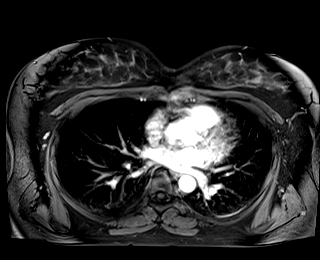

[Series 18: T1 dynamic fat-sat · axial · 3.0mm · 1.19mm/px · z∈[-68,+169]mm · 3 of 80 slices shown (2 of 5)]
[im 1/80]
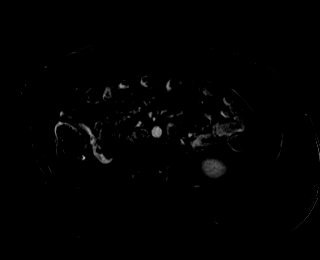
[im 40/80]
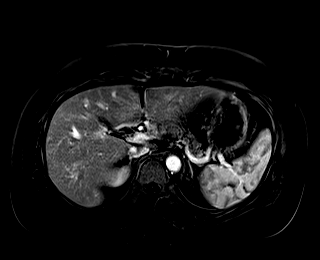
[im 80/80]
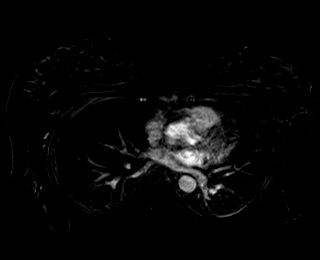

[Series 19: T1 dynamic fat-sat post-contrast · axial · 3.0mm · 1.19mm/px · z∈[-68,+169]mm · 3 of 80 slices shown (2 of 4)]
[im 1/80]
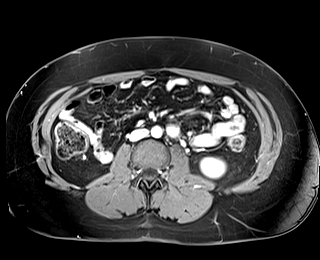
[im 40/80]
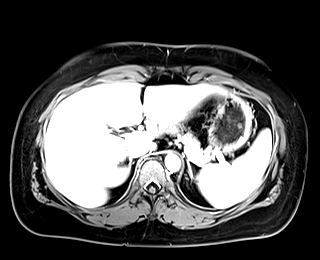
[im 80/80]
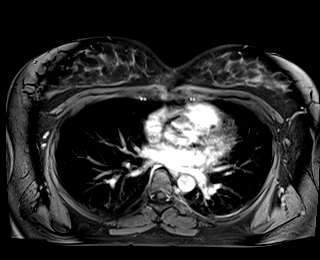

[Series 20: T1 dynamic fat-sat · axial · 3.0mm · 1.19mm/px · z∈[-68,+169]mm · 3 of 80 slices shown (3 of 5)]
[im 1/80]
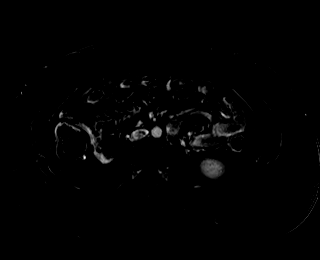
[im 40/80]
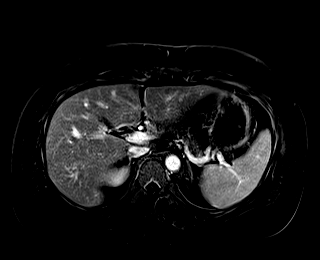
[im 80/80]
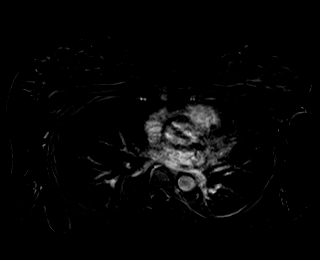

[Series 21: T1 dynamic fat-sat post-contrast · axial · 3.0mm · 1.19mm/px · z∈[-68,+169]mm · 3 of 80 slices shown (3 of 4)]
[im 1/80]
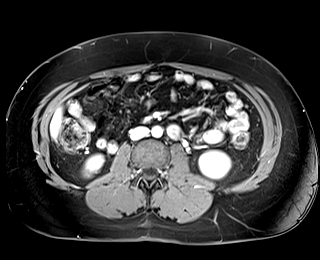
[im 40/80]
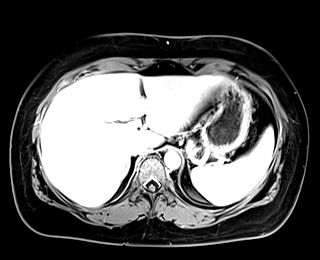
[im 80/80]
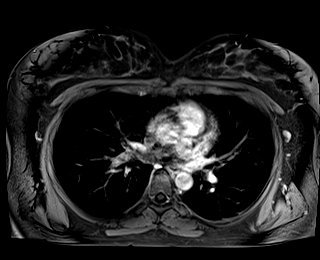

[Series 22: T1 dynamic fat-sat · axial · 3.0mm · 1.19mm/px · z∈[-68,+169]mm · 3 of 80 slices shown (4 of 5)]
[im 1/80]
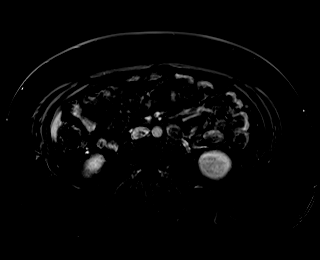
[im 40/80]
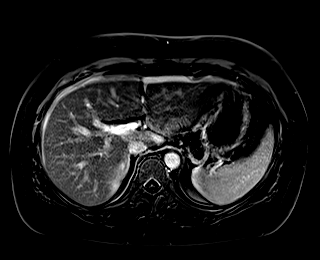
[im 80/80]
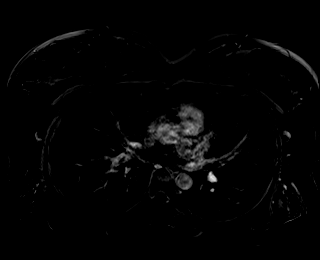

[Series 23: T1 dynamic post-contrast · coronal · 3.0mm · 1.31mm/px · 3 of 72 slices shown]
[im 1/72]
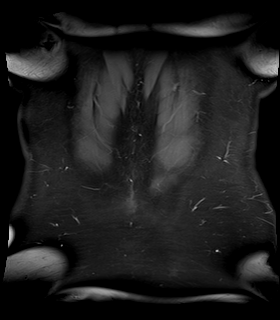
[im 36/72]
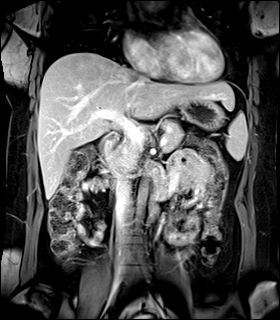
[im 72/72]
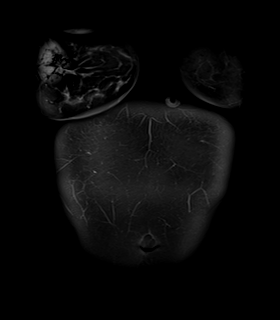

[Series 24: T1 dynamic fat-sat post-contrast · axial · 3.0mm · 1.19mm/px · z∈[-68,+169]mm · 3 of 80 slices shown (4 of 4)]
[im 1/80]
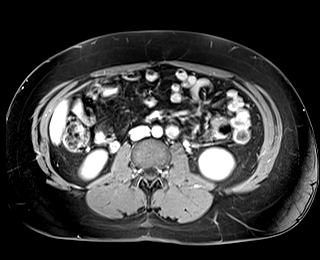
[im 40/80]
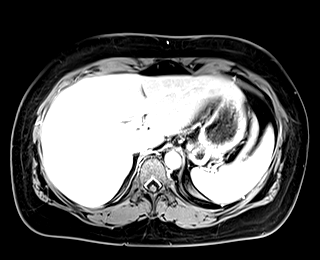
[im 80/80]
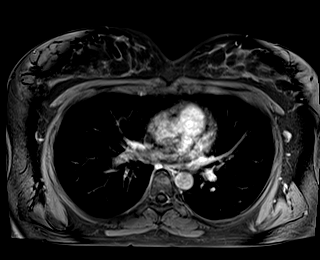

[Series 25: T1 dynamic fat-sat · axial · 3.0mm · 1.19mm/px · z∈[-68,+169]mm · 3 of 80 slices shown (5 of 5)]
[im 1/80]
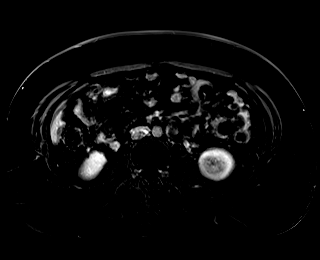
[im 40/80]
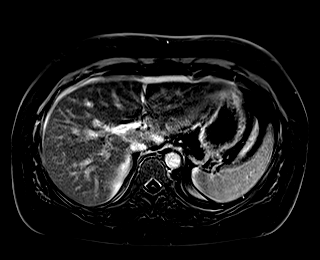
[im 80/80]
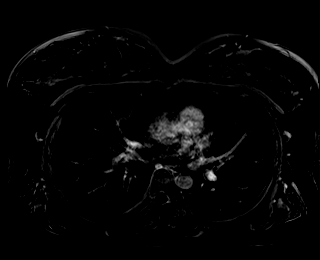

[45 of 48 positions shown; findings below may reference images not displayed]

FINDINGS: Lower chest: Trace pleural effusions.

Hepatobiliary: No mass or other parenchymal abnormality identified.
Numerous gallstones in the gallbladder. There are multiple tiny
gallstones in the distal common bile duct, measuring no greater than
2-3 mm in size (series 9, image 8).

Pancreas: No mass, inflammatory changes, or other parenchymal
abnormality identified.

Spleen:  Within normal limits in size and appearance.

Adrenals/Urinary Tract: No masses identified. No evidence of
hydronephrosis.

Stomach/Bowel: Visualized portions within the abdomen are
unremarkable.

Vascular/Lymphatic: No pathologically enlarged lymph nodes
identified. No abdominal aortic aneurysm demonstrated.

Other:  None.

Musculoskeletal: No suspicious bone lesions identified.
IMPRESSION: 1. Numerous gallstones in the gallbladder. There are multiple tiny
gallstones in the distal common bile duct, measuring no greater than
2-3 mm in size. No biliary ductal dilatation.

2. No gallbladder wall thickening or pericholecystic fluid. No
inflammatory findings.

## 2022-06-08 ENCOUNTER — Other Ambulatory Visit: Payer: Self-pay | Admitting: Sports Medicine

## 2022-06-08 ENCOUNTER — Other Ambulatory Visit (HOSPITAL_COMMUNITY): Payer: Self-pay | Admitting: Sports Medicine

## 2022-06-08 DIAGNOSIS — M654 Radial styloid tenosynovitis [de Quervain]: Secondary | ICD-10-CM

## 2022-06-08 DIAGNOSIS — S6992XA Unspecified injury of left wrist, hand and finger(s), initial encounter: Secondary | ICD-10-CM

## 2022-06-08 DIAGNOSIS — M25532 Pain in left wrist: Secondary | ICD-10-CM

## 2022-06-26 ENCOUNTER — Ambulatory Visit
Admission: RE | Admit: 2022-06-26 | Discharge: 2022-06-26 | Disposition: A | Discharge status: Home / Self Care | Payer: BC Managed Care – PPO | Source: Ambulatory Visit | Attending: Sports Medicine | Admitting: Sports Medicine

## 2022-06-26 DIAGNOSIS — M654 Radial styloid tenosynovitis [de Quervain]: Secondary | ICD-10-CM | POA: Insufficient documentation

## 2022-06-26 DIAGNOSIS — S6992XA Unspecified injury of left wrist, hand and finger(s), initial encounter: Secondary | ICD-10-CM | POA: Insufficient documentation

## 2022-06-26 DIAGNOSIS — M25532 Pain in left wrist: Secondary | ICD-10-CM | POA: Diagnosis present

## 2023-03-18 ENCOUNTER — Inpatient Hospital Stay: Admission: RE | Admit: 2023-03-18 | Payer: BC Managed Care – PPO | Source: Ambulatory Visit

## 2023-03-26 ENCOUNTER — Ambulatory Visit: Admit: 2023-03-26 | Payer: BC Managed Care – PPO | Admitting: Podiatry

## 2023-03-26 SURGERY — REPAIR, TENDON, ACHILLES
Anesthesia: Choice | Laterality: Right

## 2023-06-03 ENCOUNTER — Other Ambulatory Visit: Payer: Self-pay | Admitting: Podiatry

## 2023-06-09 ENCOUNTER — Encounter
Admission: RE | Admit: 2023-06-09 | Discharge: 2023-06-09 | Disposition: A | Discharge status: Home / Self Care | Payer: BC Managed Care – PPO | Source: Ambulatory Visit | Attending: Podiatry | Admitting: Podiatry

## 2023-06-09 ENCOUNTER — Other Ambulatory Visit: Payer: Self-pay

## 2023-06-09 DIAGNOSIS — Z01812 Encounter for preprocedural laboratory examination: Secondary | ICD-10-CM

## 2023-06-09 NOTE — Patient Instructions (Addendum)
Your procedure is scheduled on: 06/18/23 - Friday Report to the Registration Desk on the 1st floor of the [facility]. To find out your arrival time, please call [phone] between 1PM - 3PM on: 06/17/23 - Thursday If your arrival time is 6:00 am, do not arrive before that time as the [facility] entrance doors do not open until 6:00 am.  REMEMBER: Instructions that are not followed completely may result in serious medical risk, up to and including death; or upon the discretion of your surgeon and anesthesiologist your surgery may need to be rescheduled.  Do not eat food after midnight the night before surgery.  No gum chewing or hard candies.  You may however, drink CLEAR liquids up to 2 hours before you are scheduled to arrive for your surgery. Do not drink anything within 2 hours of your scheduled arrival time.  Clear liquids include: - water  - apple juice without pulp - gatorade (not RED colors) - black coffee or tea (Do NOT add milk or creamers to the coffee or tea) Do NOT drink anything that is not on this list.   In addition, your doctor has ordered for you to drink the provided:  Ensure Pre-Surgery Clear Carbohydrate Drink  Drinking this carbohydrate drink up to two hours before surgery helps to reduce insulin resistance and improve patient outcomes. Please complete drinking 2 hours before scheduled arrival time.  One week prior to surgery: Stop Anti-inflammatories (NSAIDS) such as Advil, Aleve, Ibuprofen, Motrin, Naproxen, Naprosyn and Aspirin based products such as Excedrin, Goody's Powder, BC Powder. You may take Tylenol if needed for pain up until the day of surgery.  Stop ANY OVER THE COUNTER supplements until after surgery.    TAKE ONLY THESE MEDICATIONS THE MORNING OF SURGERY WITH A SIP OF WATER:  esomeprazole (NEXIUM) - (take one the night before and one on the morning of surgery - helps to prevent nausea after surgery.) venlafaxine XR (EFFEXOR-XR)   hydrOXYzine (ATARAX) if needed.  No Alcohol for 24 hours before or after surgery.  No Smoking including e-cigarettes for 24 hours before surgery.  No chewable tobacco products for at least 6 hours before surgery.  No nicotine patches on the day of surgery.  Do not use any "recreational" drugs for at least a week (preferably 2 weeks) before your surgery.  Please be advised that the combination of cocaine and anesthesia may have negative outcomes, up to and including death. If you test positive for cocaine, your surgery will be cancelled.  On the morning of surgery brush your teeth with toothpaste and water, you may rinse your mouth with mouthwash if you wish. Do not swallow any toothpaste or mouthwash.  Use CHG Soap or wipes as directed on instruction sheet.  Do not wear jewelry, make-up, hairpins, clips or nail polish.  Do not wear lotions, powders, or perfumes.   Do not shave body hair from the neck down 48 hours before surgery.  Contact lenses, hearing aids and dentures may not be worn into surgery.  Do not bring valuables to the hospital. [facility] is not responsible for any missing/lost belongings or valuables.   Notify your doctor if there is any change in your medical condition (cold, fever, infection).  Wear comfortable clothing (specific to your surgery type) to the hospital.  After surgery, you can help prevent lung complications by doing breathing exercises.  Take deep breaths and cough every 1-2 hours. Your doctor may order a device called an Facilities manager to  help you take deep breaths. When coughing or sneezing, hold a pillow firmly against your incision with both hands. This is called "splinting." Doing this helps protect your incision. It also decreases belly discomfort.  If you are being admitted to the hospital overnight, leave your suitcase in the car. After surgery it may be brought to your room.  In case of increased patient census, it may be  necessary for you, the patient, to continue your postoperative care in the Same Day Surgery department.  If you are being discharged the day of surgery, you will not be allowed to drive home. You will need a responsible individual to drive you home and stay with you for 24 hours after surgery.   If you are taking public transportation, you will need to have a responsible individual with you.  Please call the Pre-admissions Testing Dept. at [phone] if you have any questions about these instructions.  Surgery Visitation Policy:  Patients having surgery or a procedure may have two visitors.  Children under the age of 66 must have an adult with them who is not the patient.  Inpatient Visitation:    Visiting hours are 7 a.m. to 8 p.m. Up to four visitors are allowed at one time in a patient room. The visitors may rotate out with other people during the day.  One visitor age 27 or older may stay with the patient overnight and must be in the room by 8 p.m.    Preparing for Surgery with CHLORHEXIDINE GLUCONATE (CHG) Soap  Chlorhexidine Gluconate (CHG) Soap  o An antiseptic cleaner that kills germs and bonds with the skin to continue killing germs even after washing  o Used for showering the night before surgery and morning of surgery  Before surgery, you can play an important role by reducing the number of germs on your skin.  CHG (Chlorhexidine gluconate) soap is an antiseptic cleanser which kills germs and bonds with the skin to continue killing germs even after washing.  Please do not use if you have an allergy to CHG or antibacterial soaps. If your skin becomes reddened/irritated stop using the CHG.  1. Shower the NIGHT BEFORE SURGERY and the MORNING OF SURGERY with CHG soap.  2. If you choose to wash your hair, wash your hair first as usual with your normal shampoo.  3. After shampooing, rinse your hair and body thoroughly to remove the shampoo.  4. Use CHG as you would  any other liquid soap. You can apply CHG directly to the skin and wash gently with a scrungie or a clean washcloth.  5. Apply the CHG soap to your body only from the neck down. Do not use on open wounds or open sores. Avoid contact with your eyes, ears, mouth, and genitals (private parts). Wash face and genitals (private parts) with your normal soap.  6. Wash thoroughly, paying special attention to the area where your surgery will be performed.  7. Thoroughly rinse your body with warm water.  8. Do not shower/wash with your normal soap after using and rinsing off the CHG soap.  9. Pat yourself dry with a clean towel.  10. Wear clean pajamas to bed the night before surgery.  12. Place clean sheets on your bed the night of your first shower and do not sleep with pets.  13. Shower again with the CHG soap on the day of surgery prior to arriving at the hospital.  14. Do not apply any deodorants/lotions/powders.  15. Please  wear clean clothes to the hospital.  How to Use an Incentive Spirometer  An incentive spirometer is a tool that measures how well you are filling your lungs with each breath. Learning to take long, deep breaths using this tool can help you keep your lungs clear and active. This may help to reverse or lessen your chance of developing breathing (pulmonary) problems, especially infection. You may be asked to use a spirometer: After a surgery. If you have a lung problem or a history of smoking. After a long period of time when you have been unable to move or be active. If the spirometer includes an indicator to show the highest number that you have reached, your health care provider or respiratory therapist will help you set a goal. Keep a log of your progress as told by your health care provider. What are the risks? Breathing too quickly may cause dizziness or cause you to pass out. Take your time so you do not get dizzy or light-headed. If you are in pain, you may need to  take pain medicine before doing incentive spirometry. It is harder to take a deep breath if you are having pain. How to use your incentive spirometer  Sit up on the edge of your bed or on a chair. Hold the incentive spirometer so that it is in an upright position. Before you use the spirometer, breathe out normally. Place the mouthpiece in your mouth. Make sure your lips are closed tightly around it. Breathe in slowly and as deeply as you can through your mouth, causing the piston or the ball to rise toward the top of the chamber. Hold your breath for 3-5 seconds, or for as long as possible. If the spirometer includes a coach indicator, use this to guide you in breathing. Slow down your breathing if the indicator goes above the marked areas. Remove the mouthpiece from your mouth and breathe out normally. The piston or ball will return to the bottom of the chamber. Rest for a few seconds, then repeat the steps 10 or more times. Take your time and take a few normal breaths between deep breaths so that you do not get dizzy or light-headed. Do this every 1-2 hours when you are awake. If the spirometer includes a goal marker to show the highest number you have reached (best effort), use this as a goal to work toward during each repetition. After each set of 10 deep breaths, cough a few times. This will help to make sure that your lungs are clear. If you have an incision on your chest or abdomen from surgery, place a pillow or a rolled-up towel firmly against the incision when you cough. This can help to reduce pain while taking deep breaths and coughing. General tips When you are able to get out of bed: Walk around often. Continue to take deep breaths and cough in order to clear your lungs. Keep using the incentive spirometer until your health care provider says it is okay to stop using it. If you have been in the hospital, you may be told to keep using the spirometer at home. Contact a health care  provider if: You are having difficulty using the spirometer. You have trouble using the spirometer as often as instructed. Your pain medicine is not giving enough relief for you to use the spirometer as told. You have a fever. Get help right away if: You develop shortness of breath. You develop a cough with bloody mucus from the lungs.  You have fluid or blood coming from an incision site after you cough. Summary An incentive spirometer is a tool that can help you learn to take long, deep breaths to keep your lungs clear and active. You may be asked to use a spirometer after a surgery, if you have a lung problem or a history of smoking, or if you have been inactive for a long period of time. Use your incentive spirometer as instructed every 1-2 hours while you are awake. If you have an incision on your chest or abdomen, place a pillow or a rolled-up towel firmly against your incision when you cough. This will help to reduce pain. Get help right away if you have shortness of breath, you cough up bloody mucus, or blood comes from your incision when you cough. This information is not intended to replace advice given to you by your health care provider. Make sure you discuss any questions you have with your health care provider. Document Revised: 01/08/2020 Document Reviewed: 01/08/2020 Elsevier Patient Education  2023 [employer].

## 2023-06-17 MED ORDER — CHLORHEXIDINE GLUCONATE 0.12 % MT SOLN
15.0000 mL | Freq: Once | OROMUCOSAL | Status: AC
  Administered 2023-06-18: 15 mL via OROMUCOSAL

## 2023-06-17 MED ORDER — CEFAZOLIN SODIUM-DEXTROSE 2-4 GM/100ML-% IV SOLN
2.0000 g | INTRAVENOUS | Status: AC
  Administered 2023-06-18: 2 g via INTRAVENOUS

## 2023-06-17 MED ORDER — ORAL CARE MOUTH RINSE: 15.0000 mL | Freq: Once | OROMUCOSAL | Status: AC

## 2023-06-18 ENCOUNTER — Other Ambulatory Visit: Payer: Self-pay

## 2023-06-18 ENCOUNTER — Ambulatory Visit
Admission: RE | Admit: 2023-06-18 | Discharge: 2023-06-18 | Disposition: A | Discharge status: Home / Self Care | Payer: BC Managed Care – PPO | Attending: Podiatry | Admitting: Podiatry

## 2023-06-18 ENCOUNTER — Encounter: Payer: Self-pay | Admitting: Podiatry

## 2023-06-18 ENCOUNTER — Ambulatory Visit: Payer: BC Managed Care – PPO

## 2023-06-18 ENCOUNTER — Ambulatory Visit: Payer: BC Managed Care – PPO | Admitting: Anesthesiology

## 2023-06-18 ENCOUNTER — Encounter
Admission: RE | Disposition: A | Discharge status: Home / Self Care | Payer: Self-pay | Source: Home / Self Care | Attending: Podiatry

## 2023-06-18 DIAGNOSIS — K219 Gastro-esophageal reflux disease without esophagitis: Secondary | ICD-10-CM | POA: Insufficient documentation

## 2023-06-18 DIAGNOSIS — M7661 Achilles tendinitis, right leg: Secondary | ICD-10-CM | POA: Diagnosis not present

## 2023-06-18 DIAGNOSIS — M898X7 Other specified disorders of bone, ankle and foot: Secondary | ICD-10-CM | POA: Diagnosis not present

## 2023-06-18 DIAGNOSIS — M79671 Pain in right foot: Secondary | ICD-10-CM | POA: Diagnosis not present

## 2023-06-18 DIAGNOSIS — Z01812 Encounter for preprocedural laboratory examination: Secondary | ICD-10-CM

## 2023-06-18 DIAGNOSIS — F418 Other specified anxiety disorders: Secondary | ICD-10-CM | POA: Insufficient documentation

## 2023-06-18 HISTORY — PX: OSTECTOMY: SHX6439

## 2023-06-18 HISTORY — PX: ACHILLES TENDON SURGERY: SHX542

## 2023-06-18 LAB — POCT PREGNANCY, URINE: Preg Test, Ur: NEGATIVE

## 2023-06-18 SURGERY — REPAIR, TENDON, ACHILLES
Anesthesia: General | Site: Foot | Laterality: Right

## 2023-06-18 MED ORDER — ACETAMINOPHEN 10 MG/ML IV SOLN
INTRAVENOUS | Status: DC | PRN
  Administered 2023-06-18: 1000 mg via INTRAVENOUS

## 2023-06-18 MED ORDER — ACETAMINOPHEN 10 MG/ML IV SOLN
INTRAVENOUS | Status: AC
  Filled 2023-06-18: qty 100

## 2023-06-18 MED ORDER — PROPOFOL 10 MG/ML IV BOLUS
INTRAVENOUS | Status: DC | PRN
Start: 2023-06-18 — End: 2023-06-18
  Administered 2023-06-18: 160 mg via INTRAVENOUS
  Administered 2023-06-18: 40 mg via INTRAVENOUS

## 2023-06-18 MED ORDER — PROPOFOL 10 MG/ML IV BOLUS
INTRAVENOUS | Status: AC
  Filled 2023-06-18: qty 20

## 2023-06-18 MED ORDER — OXYCODONE-ACETAMINOPHEN 5-325 MG PO TABS: 1.0000 | ORAL_TABLET | Freq: Four times a day (QID) | ORAL | 0 refills | Status: AC | PRN

## 2023-06-18 MED ORDER — FENTANYL CITRATE (PF) 100 MCG/2ML IJ SOLN
INTRAMUSCULAR | Status: AC
  Filled 2023-06-18: qty 2

## 2023-06-18 MED ORDER — ONDANSETRON HCL 4 MG PO TABS: 4.0000 mg | ORAL_TABLET | Freq: Four times a day (QID) | ORAL | Status: DC | PRN

## 2023-06-18 MED ORDER — ROCURONIUM BROMIDE 10 MG/ML (PF) SYRINGE
PREFILLED_SYRINGE | INTRAVENOUS | Status: AC
  Filled 2023-06-18: qty 10

## 2023-06-18 MED ORDER — CHLORHEXIDINE GLUCONATE 0.12 % MT SOLN
OROMUCOSAL | Status: AC
  Filled 2023-06-18: qty 15

## 2023-06-18 MED ORDER — ONDANSETRON HCL 4 MG/2ML IJ SOLN
INTRAMUSCULAR | Status: AC
  Filled 2023-06-18: qty 2

## 2023-06-18 MED ORDER — LIDOCAINE HCL (CARDIAC) PF 100 MG/5ML IV SOSY
PREFILLED_SYRINGE | INTRAVENOUS | Status: DC | PRN
  Administered 2023-06-18: 50 mg via INTRAVENOUS

## 2023-06-18 MED ORDER — FENTANYL CITRATE (PF) 100 MCG/2ML IJ SOLN
25.0000 ug | INTRAMUSCULAR | Status: DC | PRN
  Administered 2023-06-18 (×2): 50 ug via INTRAVENOUS

## 2023-06-18 MED ORDER — PHENYLEPHRINE HCL (PRESSORS) 10 MG/ML IV SOLN
INTRAVENOUS | Status: DC | PRN
  Administered 2023-06-18 (×3): 80 ug via INTRAVENOUS

## 2023-06-18 MED ORDER — METOCLOPRAMIDE HCL 10 MG PO TABS: 5.0000 mg | ORAL_TABLET | Freq: Three times a day (TID) | ORAL | Status: DC | PRN

## 2023-06-18 MED ORDER — FENTANYL CITRATE (PF) 100 MCG/2ML IJ SOLN
INTRAMUSCULAR | Status: DC | PRN
  Administered 2023-06-18 (×2): 50 ug via INTRAVENOUS

## 2023-06-18 MED ORDER — 0.9 % SODIUM CHLORIDE (POUR BTL) OPTIME
TOPICAL | Status: DC | PRN
  Administered 2023-06-18: 800 mL

## 2023-06-18 MED ORDER — MIDAZOLAM HCL 2 MG/2ML IJ SOLN
INTRAMUSCULAR | Status: AC
  Filled 2023-06-18: qty 2

## 2023-06-18 MED ORDER — LACTATED RINGERS IV SOLN: INTRAVENOUS | Status: DC | PRN

## 2023-06-18 MED ORDER — CEFAZOLIN SODIUM-DEXTROSE 2-4 GM/100ML-% IV SOLN
INTRAVENOUS | Status: AC
  Filled 2023-06-18: qty 100

## 2023-06-18 MED ORDER — DEXAMETHASONE SODIUM PHOSPHATE 10 MG/ML IJ SOLN
INTRAMUSCULAR | Status: AC
  Filled 2023-06-18: qty 1

## 2023-06-18 MED ORDER — BUPIVACAINE-EPINEPHRINE (PF) 0.25% -1:200000 IJ SOLN
INTRAMUSCULAR | Status: DC | PRN
  Administered 2023-06-18: 20 mL via SURGICAL_CAVITY

## 2023-06-18 MED ORDER — SUGAMMADEX SODIUM 200 MG/2ML IV SOLN
INTRAVENOUS | Status: DC | PRN
  Administered 2023-06-18: 2 mg via INTRAVENOUS

## 2023-06-18 MED ORDER — LIDOCAINE HCL (PF) 2 % IJ SOLN
INTRAMUSCULAR | Status: AC
  Filled 2023-06-18: qty 5

## 2023-06-18 MED ORDER — LACTATED RINGERS IV SOLN: INTRAVENOUS | Status: DC

## 2023-06-18 MED ORDER — BUPIVACAINE HCL (PF) 0.25 % IJ SOLN
INTRAMUSCULAR | Status: AC
  Filled 2023-06-18: qty 30

## 2023-06-18 MED ORDER — DEXAMETHASONE SODIUM PHOSPHATE 10 MG/ML IJ SOLN
INTRAMUSCULAR | Status: DC | PRN
  Administered 2023-06-18: 10 mg via INTRAVENOUS

## 2023-06-18 MED ORDER — PHENYLEPHRINE 80 MCG/ML (10ML) SYRINGE FOR IV PUSH (FOR BLOOD PRESSURE SUPPORT)
PREFILLED_SYRINGE | INTRAVENOUS | Status: AC
  Filled 2023-06-18: qty 10

## 2023-06-18 MED ORDER — ONDANSETRON HCL 4 MG/2ML IJ SOLN: 4.0000 mg | Freq: Four times a day (QID) | INTRAMUSCULAR | Status: DC | PRN

## 2023-06-18 MED ORDER — OXYCODONE HCL 5 MG/5ML PO SOLN: 5.0000 mg | Freq: Once | ORAL | Status: AC | PRN

## 2023-06-18 MED ORDER — ROCURONIUM BROMIDE 100 MG/10ML IV SOLN
INTRAVENOUS | Status: DC | PRN
  Administered 2023-06-18: 10 mg via INTRAVENOUS
  Administered 2023-06-18: 40 mg via INTRAVENOUS
  Administered 2023-06-18: 10 mg via INTRAVENOUS
  Administered 2023-06-18: 20 mg via INTRAVENOUS

## 2023-06-18 MED ORDER — OXYCODONE HCL 5 MG PO TABS
5.0000 mg | ORAL_TABLET | Freq: Once | ORAL | Status: AC | PRN
  Administered 2023-06-18: 5 mg via ORAL

## 2023-06-18 MED ORDER — OXYCODONE HCL 5 MG PO TABS
ORAL_TABLET | ORAL | Status: AC
  Filled 2023-06-18: qty 1

## 2023-06-18 MED ORDER — BUPIVACAINE LIPOSOME 1.3 % IJ SUSP
INTRAMUSCULAR | Status: AC
  Filled 2023-06-18: qty 20

## 2023-06-18 MED ORDER — ONDANSETRON HCL 4 MG/2ML IJ SOLN
INTRAMUSCULAR | Status: DC | PRN
  Administered 2023-06-18: 4 mg via INTRAVENOUS

## 2023-06-18 MED ORDER — METOCLOPRAMIDE HCL 5 MG/ML IJ SOLN: 5.0000 mg | Freq: Three times a day (TID) | INTRAMUSCULAR | Status: DC | PRN

## 2023-06-18 MED ORDER — KETOROLAC TROMETHAMINE 30 MG/ML IJ SOLN
INTRAMUSCULAR | Status: DC | PRN
  Administered 2023-06-18: 15 mg via INTRAVENOUS

## 2023-06-18 MED ORDER — MIDAZOLAM HCL 2 MG/2ML IJ SOLN
INTRAMUSCULAR | Status: DC | PRN
  Administered 2023-06-18: 2 mg via INTRAVENOUS

## 2023-06-18 SURGICAL SUPPLY — 71 items
ANCH SUT 4.5 FTPRNT PEEK-OPTM (Anchor) ×2 IMPLANT
ANCHOR 4.5 FOOTPRINT ULTRA (Anchor) IMPLANT
BIT DRILL 4X4.5 FOOTPRINT STR (BIT) ×2 IMPLANT
BLADE SURG 15 STRL LF DISP TIS (BLADE) ×2 IMPLANT
BLADE SURG 15 STRL SS (BLADE) ×2
BLADE SURG MINI STRL (BLADE) ×2 IMPLANT
BNDG CMPR 75X21 PLY HI ABS (MISCELLANEOUS) ×2
BNDG CMPR STD VLCR NS LF 5.8X4 (GAUZE/BANDAGES/DRESSINGS) ×4
BNDG ELASTIC 4X5.8 VLCR NS LF (GAUZE/BANDAGES/DRESSINGS) ×4 IMPLANT
BNDG ESMARCH 4 X 12 STRL LF (GAUZE/BANDAGES/DRESSINGS) ×2
BNDG ESMARCH 4X12 STRL LF (GAUZE/BANDAGES/DRESSINGS) ×2 IMPLANT
BNDG ESMARCH 6 X 12 STRL LF (GAUZE/BANDAGES/DRESSINGS) ×2
BNDG ESMARCH 6X12 STRL LF (GAUZE/BANDAGES/DRESSINGS) ×2 IMPLANT
BNDG GAUZE DERMACEA FLUFF 4 (GAUZE/BANDAGES/DRESSINGS) ×2 IMPLANT
BNDG GZE 12X3 1 PLY HI ABS (GAUZE/BANDAGES/DRESSINGS) ×2
BNDG GZE DERMACEA 4 6PLY (GAUZE/BANDAGES/DRESSINGS) ×2
BNDG STRETCH GAUZE 3IN X12FT (GAUZE/BANDAGES/DRESSINGS) ×2 IMPLANT
BOOT STEPPER DURA MED (SOFTGOODS) IMPLANT
DRAPE FLUOR MINI C-ARM 54X84 (DRAPES) ×2 IMPLANT
DRILL 4X4.5 FOOTPRINT STR (BIT) ×4
DURAPREP 26ML APPLICATOR (WOUND CARE) ×2 IMPLANT
ELECT REM PT RETURN 9FT ADLT (ELECTROSURGICAL) ×2
ELECTRODE REM PT RTRN 9FT ADLT (ELECTROSURGICAL) ×2 IMPLANT
GAUZE SPONGE 4X4 12PLY STRL (GAUZE/BANDAGES/DRESSINGS) ×2 IMPLANT
GAUZE STRETCH 2X75IN STRL (MISCELLANEOUS) ×2 IMPLANT
GAUZE XEROFORM 1X8 LF (GAUZE/BANDAGES/DRESSINGS) ×2 IMPLANT
GLOVE BIO SURGEON STRL SZ7.5 (GLOVE) ×2 IMPLANT
GLOVE INDICATOR 8.0 STRL GRN (GLOVE) ×2 IMPLANT
GOWN STRL REUS W/ TWL XL LVL3 (GOWN DISPOSABLE) ×4 IMPLANT
GOWN STRL REUS W/TWL XL LVL3 (GOWN DISPOSABLE) ×4
HANDLE YANKAUER SUCT BULB TIP (MISCELLANEOUS) ×2 IMPLANT
IV NS 500ML (IV SOLUTION) ×2
IV NS 500ML BAXH (IV SOLUTION) ×2 IMPLANT
KIT TURNOVER KIT A (KITS) ×2 IMPLANT
LABEL OR SOLS (LABEL) IMPLANT
MANIFOLD NEPTUNE II (INSTRUMENTS) ×2 IMPLANT
NDL FILTER BLUNT 18X1 1/2 (NEEDLE) ×2 IMPLANT
NDL HYPO 18GX1.5 BLUNT FILL (NEEDLE) ×2 IMPLANT
NDL HYPO 25X1 1.5 SAFETY (NEEDLE) ×6 IMPLANT
NDL MAYO CATGUT SZ5 (NEEDLE) ×2
NDL SUT 5 .5 CRC TPR PNT MAYO (NEEDLE) ×2 IMPLANT
NEEDLE FILTER BLUNT 18X1 1/2 (NEEDLE) ×2 IMPLANT
NEEDLE HYPO 18GX1.5 BLUNT FILL (NEEDLE) ×2 IMPLANT
NEEDLE HYPO 25X1 1.5 SAFETY (NEEDLE) ×6 IMPLANT
NS IRRIG 500ML POUR BTL (IV SOLUTION) ×2 IMPLANT
PACK EXTREMITY ARMC (MISCELLANEOUS) ×2 IMPLANT
RASP SM TEAR CROSS CUT (RASP) IMPLANT
SPLINT CAST 1 STEP 5X30 WHT (MISCELLANEOUS) ×2 IMPLANT
SPLINT PLASTER CAST FAST 5X30 (CAST SUPPLIES) ×2 IMPLANT
SPONGE T-LAP 18X18 ~~LOC~~+RFID (SPONGE) ×2 IMPLANT
STOCKINETTE IMPERVIOUS 9X36 MD (GAUZE/BANDAGES/DRESSINGS) ×2 IMPLANT
STOCKINETTE M/LG 89821 (MISCELLANEOUS) ×2 IMPLANT
STRIP CLOSURE SKIN 1/2X4 (GAUZE/BANDAGES/DRESSINGS) ×2 IMPLANT
SUT MNCRL 4-0 (SUTURE) ×2
SUT MNCRL 4-0 27XMFL (SUTURE) ×2
SUT PDS AB 0 CT1 27 (SUTURE) IMPLANT
SUT ULTRABRAID #2 38 (SUTURE) ×2 IMPLANT
SUT VIC AB 0 SH 27 (SUTURE) ×2 IMPLANT
SUT VIC AB 2-0 SH 27 (SUTURE) ×4
SUT VIC AB 2-0 SH 27XBRD (SUTURE) ×4 IMPLANT
SUT VIC AB 3-0 SH 27 (SUTURE) ×2
SUT VIC AB 3-0 SH 27X BRD (SUTURE) ×2 IMPLANT
SUT VIC AB 4-0 FS2 27 (SUTURE) ×2 IMPLANT
SUT VICRYL AB 3-0 FS1 BRD 27IN (SUTURE) ×2 IMPLANT
SUTURE MNCRL 4-0 27XMF (SUTURE) ×2 IMPLANT
SWABSTK COMLB BENZOIN TINCTURE (MISCELLANEOUS) ×2 IMPLANT
SYR 10ML LL (SYRINGE) ×4 IMPLANT
SYR 3ML LL SCALE MARK (SYRINGE) ×2 IMPLANT
TRAP FLUID SMOKE EVACUATOR (MISCELLANEOUS) ×2 IMPLANT
WAND TOPAZ MICRO DEBRIDER (MISCELLANEOUS) IMPLANT
WATER STERILE IRR 500ML POUR (IV SOLUTION) ×2 IMPLANT

## 2023-06-18 NOTE — Anesthesia Postprocedure Evaluation (Signed)
Anesthesia Post Note  Patient: Jennifer Meyers  Procedure(s) Performed: 16109 - ACHILLES REPAIR - SECONDARY (Right: Foot) [id number] - HAGLUNDS/RETROCALCANEAL OSTECTOMY (Right)  Patient location during evaluation: PACU Anesthesia Type: General Level of consciousness: awake and alert Pain management: pain level controlled Vital Signs Assessment: post-procedure vital signs reviewed and stable Respiratory status: spontaneous breathing, nonlabored ventilation, respiratory function stable and patient connected to nasal cannula oxygen Cardiovascular status: blood pressure returned to baseline and stable Postop Assessment: no apparent nausea or vomiting Anesthetic complications: no   No notable events documented.   Last Vitals:  Vitals:   06/18/23 1300 06/18/23 1317  BP: 112/80 116/71  Pulse: 86 85  Resp: 13 18  Temp: (!) 36.4 C   SpO2: 95% 98%    Last Pain:  Vitals:   06/18/23 1317  TempSrc: Temporal  PainSc: 0-No pain                 Louie Boston

## 2023-06-18 NOTE — H&P (Signed)
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  The various methods of treatment have been discussed with the patient.  No guarantees were given.  After consideration of risks, benefits and other options for treatment, the patient has consented to surgery.  I have reviewed the patients' chart and labs.     A history and physical examination was performed in my office.  The patient was reexamined.  There have been no changes to this history and physical examination.  Gwyneth Revels A

## 2023-06-18 NOTE — Anesthesia Preprocedure Evaluation (Addendum)
Anesthesia Evaluation  Patient identified by MRN, date of birth, ID band Patient awake    Reviewed: Allergy & Precautions, NPO status , Patient's Chart, lab work & pertinent test results  History of Anesthesia Complications Negative for: history of anesthetic complications  Airway Mallampati: I  TM Distance: >3 FB Neck ROM: full    Dental no notable dental hx.    Pulmonary neg pulmonary ROS   Pulmonary exam normal        Cardiovascular negative cardio ROS Normal cardiovascular exam     Neuro/Psych  Headaches PSYCHIATRIC DISORDERS Anxiety Depression       GI/Hepatic Neg liver ROS,GERD  Controlled,,  Endo/Other  negative endocrine ROS    Renal/GU      Musculoskeletal   Abdominal   Peds  Hematology negative hematology ROS (+)   Anesthesia Other Findings Past Medical History: No date: Achilles tendinitis of right lower extremity No date: ADHD (attention deficit hyperactivity disorder) No date: Anxiety No date: COVID-19 No date: Depression No date: Dyspepsia No date: Haglund's deformity, right No date: Headache No date: Right foot pain  Past Surgical History: 2023: CESAREAN SECTION No date: CHOLECYSTECTOMY No date: de quervain's Release Left 08/17/2022 No date: DILATION AND CURETTAGE OF UTERUS No date: EGD 05/12/2021 03/07/2020: ERCP; N/A     Comment:  Procedure: ENDOSCOPIC RETROGRADE               CHOLANGIOPANCREATOGRAPHY (ERCP);  Surgeon: Midge Minium,               MD;  Location: [facility] ENDOSCOPY;  Service: Endoscopy;                Laterality: N/A; No date: KNEE ARTHROSCOPY     Reproductive/Obstetrics negative OB ROS                             Anesthesia Physical Anesthesia Plan  ASA: 2  Anesthesia Plan: General LMA   Post-op Pain Management: Ofirmev IV (intra-op)*, Toradol IV (intra-op)* and Regional block*   Induction: Intravenous  PONV Risk Score and Plan: 2 and  Dexamethasone, Ondansetron, Midazolam and Treatment may vary due to age or medical condition  Airway Management Planned: LMA  Additional Equipment:   Intra-op Plan:   Post-operative Plan: Extubation in OR  Informed Consent: I have reviewed the patients History and Physical, chart, labs and discussed the procedure including the risks, benefits and alternatives for the proposed anesthesia with the patient or authorized representative who has indicated his/her understanding and acceptance.     Dental Advisory Given  Plan Discussed with: Anesthesiologist, CRNA and Surgeon  Anesthesia Plan Comments: (Patient consented for risks of anesthesia including but not limited to:  - adverse reactions to medications - damage to eyes, teeth, lips or other oral mucosa - nerve damage due to positioning  - sore throat or hoarseness - Damage to heart, brain, nerves, lungs, other parts of body or loss of life  Patient voiced understanding.)       Anesthesia Quick Evaluation

## 2023-06-18 NOTE — Transfer of Care (Signed)
Immediate Anesthesia Transfer of Care Note  Patient: Jennifer Meyers  Procedure(s) Performed: 40981 - ACHILLES REPAIR - SECONDARY (Right: Foot) [phone] - HAGLUNDS/RETROCALCANEAL OSTECTOMY (Right)  Patient Location: PACU  Anesthesia Type:General  Level of Consciousness: drowsy and patient cooperative  Airway & Oxygen Therapy: Patient Spontanous Breathing and Patient connected to face mask oxygen  Post-op Assessment: Report given to RN and Patient moving all extremities X 4  Post vital signs: Reviewed and stable  Last Vitals:  Vitals Value Taken Time  BP 96/53 06/18/23 1220  Temp 36.3 C 06/18/23 1220  Pulse 85 06/18/23 1220  Resp 12 06/18/23 1220  SpO2 100 % 06/18/23 1220  Vitals shown include unfiled device data.  Last Pain:  Vitals:   06/18/23 1220  TempSrc: Skin  PainSc:          Complications: No notable events documented.

## 2023-06-18 NOTE — Anesthesia Procedure Notes (Addendum)
Procedure Name: Intubation Date/Time: 06/18/2023 10:20 AM  Performed by: Lily Lovings, CRNAPre-anesthesia Checklist: Patient identified, Patient being monitored, Timeout performed, Emergency Drugs available and Suction available Patient Re-evaluated:Patient Re-evaluated prior to induction Oxygen Delivery Method: Circle system utilized Preoxygenation: Pre-oxygenation with 100% oxygen Induction Type: IV induction Ventilation: Mask ventilation without difficulty Laryngoscope Size: 3 and McGraph Grade View: Grade I Tube type: Oral Tube size: 7.0 mm Number of attempts: 1 Airway Equipment and Method: Stylet Placement Confirmation: ETT inserted through vocal cords under direct vision, positive ETCO2 and breath sounds checked- equal and bilateral Secured at: 21 cm Tube secured with: Tape Dental Injury: Teeth and Oropharynx as per pre-operative assessment  Comments: Soft bite block

## 2023-06-18 NOTE — Discharge Instructions (Addendum)
[place] ?[place] ? ?POST OPERATIVE INSTRUCTIONS FOR DR. Vickki Muff AND DR. BAKER ?[place] ? ? ?Take your medication as prescribed.  Pain medication should be taken only as needed. ? ?Keep the dressing clean, dry and intact. ? ?Keep your foot elevated above the heart level for the first 48 hours. ? ?We have instructed you to be non-weight bearing. ? ?Always wear your post-op shoe when walking.  Always use your crutches if you are to be non-weight bearing. ? ?Do not take a shower. Baths are permissible as long as the foot is kept out of the water.  ? ?Every hour you are awake:  ?Bend your knee 15 times. ? ?Call [facility] [phone]) if any of the following problems occur: ?You develop a temperature or fever. ?The bandage becomes saturated with blood. ?Medication does not stop your pain. ?Injury of the foot occurs. ?Any symptoms of infection including redness, odor, or red streaks running from wound.  ? ? ? ?AMBULATORY SURGERY  ?DISCHARGE INSTRUCTIONS ? ? ?The drugs that you were given will stay in your system until tomorrow so for the next 24 hours you should not: ? ?Drive an automobile ?Make any legal decisions ?Drink any alcoholic beverage ? ? ?You may resume regular meals tomorrow.  Today it is better to start with liquids and gradually work up to solid foods. ? ?You may eat anything you prefer, but it is better to start with liquids, then soup and crackers, and gradually work up to solid foods. ? ? ?Please notify your doctor immediately if you have any unusual bleeding, trouble breathing, redness and pain at the surgery site, drainage, fever, or pain not relieved by medication. ? ? ? ?Additional Instructions: ? ? ? ?PLEASE LEAVE TEAL BRACELET ON FOR 4 DAYS! ? ? ? ?Please contact your physician with any problems or Same Day Surgery at [phone], Monday through Friday 6 am to 4 pm, or  at [facility] number at [phone].  ?

## 2023-06-18 NOTE — Op Note (Signed)
Operative note   Surgeon:Krisha Beegle Armed forces logistics/support/administrative officer: None none    Preop diagnosis: 1.  Achilles insertional tendinitis right heel 2.  Posterior calcaneal exostosis right heel    Postop diagnosis: 1.  Achilles insertional tendinitis with intratendinous soft tissue mass right heel 2.  Posterior calcaneal exostosis right heel    Procedure: 1.  Excision of intratendinous soft tissue mass posterior Achilles 2.  Achilles tendon debridement with repair 3.  Posterior calcaneal exostectomy 4.  Intraoperative fluoroscopy without assistance of radiologist    EBL: Minimal    Anesthesia:local and general.  Local consisting of a total of 20 cc of one-to-one mixture of 0.25% bupivacaine with epinephrine and Exparel long-acting anesthetic    Hemostasis: Bupivacaine with epinephrine infiltrated along the incision site    Specimen: Intratendinous soft tissue mass posterior right Achilles    Complications: None    Operative indications:Dakota N. Hyden is an 37 y.o. that presents today for surgical intervention.  The risks/benefits/alternatives/complications have been discussed and consent has been given.    Procedure:  Patient was brought into the OR and placed on the operating table in theprone position. After anesthesia was obtained theright lower extremity was prepped and draped in usual sterile fashion.  Attention was directed to the posterolateral aspect of the right Achilles insertional site where longitudinal incision was made overlying the prominence.  Sharp and blunt dissection carried down to the peritenon.  This was then incised.  At this time it exposed a intratendinous soft tissue mass likely cystic type of mass.  Wide excision of the mass was performed and this was sent for pathological examination.  At this time the posterior aspect of the calcaneus was exposed.  The tendon was then dissected away from the posterior and posterior lateral aspect of the calcaneus.  There was no to be a bony  prominence just deep to the soft tissue mass and a posterior calcaneal exostosis on the entire posterior aspect of the calcaneus.  This was then smoothed then removed with a power rasp and rongeur.  Small posterior superior calcaneal exostosis was noted as well.  Intratendinous calcification was noted and thickening of the tendon was noted as well and this was debrided away.  The wound was flushed with copious amounts of irrigation.  The tendon was then infiltrated with a Topaz wand.  The tendon splitting incision was then repaired with a 2-0 Vicryl.  Next a Magnum wire was used to stabilize the Achilles tendon and a U nail suture type and this was implanted into the calcaneus with a 5.0 mm footprint bone anchor.  Good plantarflexion was noted.  Intraoperative fluoroscopy was used throughout the procedure to demonstrate removal of the posterior calcaneus and placement of the bone anchor.  At this time the peritenon was then reanastomosed with a 3-0 Vicryl as well as the subcutaneous tissue.  The skin was reanastomosed with a Monocryl.  A bulky sterile dressing was applied to the right lower extremity.  She was placed in a equalizer walker boot in the PACU.    Patient tolerated the procedure and anesthesia well.  Was transported from the OR to the PACU with all vital signs stable and vascular status intact. To be discharged per routine protocol.  Will follow up in approximately 1 week in the outpatient clinic.
